# Patient Record
Sex: Female | Born: 1970
Health system: Southern US, Community
[De-identification: ages and names within clinical notes are randomized; demographics above are authoritative.]

## PROBLEM LIST (undated history)

## (undated) DIAGNOSIS — Z8619 Personal history of other infectious and parasitic diseases: Secondary | ICD-10-CM

## (undated) DIAGNOSIS — I10 Essential (primary) hypertension: Secondary | ICD-10-CM

## (undated) DIAGNOSIS — A599 Trichomoniasis, unspecified: Principal | ICD-10-CM

## (undated) HISTORY — DX: Essential (primary) hypertension: I10

## (undated) HISTORY — DX: Trichomoniasis, unspecified: A59.9

## (undated) HISTORY — DX: Personal history of other infectious and parasitic diseases: Z86.19

---

## 2008-10-06 ENCOUNTER — Other Ambulatory Visit: Admission: RE | Admit: 2008-10-06 | Discharge: 2008-10-06 | Payer: Self-pay | Admitting: Obstetrics and Gynecology

## 2009-10-16 ENCOUNTER — Other Ambulatory Visit: Admission: RE | Admit: 2009-10-16 | Discharge: 2009-10-16 | Payer: Self-pay | Admitting: Obstetrics and Gynecology

## 2010-11-26 ENCOUNTER — Other Ambulatory Visit (HOSPITAL_COMMUNITY)
Admission: RE | Admit: 2010-11-26 | Discharge: 2010-11-26 | Disposition: A | Discharge status: Home / Self Care | Payer: BC Managed Care – PPO | Source: Ambulatory Visit | Attending: Obstetrics and Gynecology | Admitting: Obstetrics and Gynecology

## 2010-11-26 ENCOUNTER — Other Ambulatory Visit: Payer: Self-pay | Admitting: Obstetrics & Gynecology

## 2010-11-26 DIAGNOSIS — Z01419 Encounter for gynecological examination (general) (routine) without abnormal findings: Secondary | ICD-10-CM | POA: Insufficient documentation

## 2010-11-26 DIAGNOSIS — Z139 Encounter for screening, unspecified: Secondary | ICD-10-CM

## 2010-12-04 ENCOUNTER — Ambulatory Visit (HOSPITAL_COMMUNITY)
Admission: RE | Admit: 2010-12-04 | Discharge: 2010-12-04 | Disposition: A | Discharge status: Home / Self Care | Payer: BC Managed Care – PPO | Source: Ambulatory Visit | Attending: Obstetrics & Gynecology | Admitting: Obstetrics & Gynecology

## 2010-12-04 ENCOUNTER — Other Ambulatory Visit: Payer: Self-pay | Admitting: Obstetrics & Gynecology

## 2010-12-04 DIAGNOSIS — Z1231 Encounter for screening mammogram for malignant neoplasm of breast: Secondary | ICD-10-CM | POA: Insufficient documentation

## 2010-12-04 DIAGNOSIS — Z139 Encounter for screening, unspecified: Secondary | ICD-10-CM

## 2011-12-31 ENCOUNTER — Other Ambulatory Visit (HOSPITAL_COMMUNITY)
Admission: RE | Admit: 2011-12-31 | Discharge: 2011-12-31 | Disposition: A | Discharge status: Home / Self Care | Payer: BC Managed Care – PPO | Source: Ambulatory Visit | Attending: Obstetrics and Gynecology | Admitting: Obstetrics and Gynecology

## 2011-12-31 DIAGNOSIS — Z1151 Encounter for screening for human papillomavirus (HPV): Secondary | ICD-10-CM | POA: Insufficient documentation

## 2011-12-31 DIAGNOSIS — Z01419 Encounter for gynecological examination (general) (routine) without abnormal findings: Secondary | ICD-10-CM | POA: Insufficient documentation

## 2012-01-21 ENCOUNTER — Other Ambulatory Visit: Payer: Self-pay | Admitting: Adult Health

## 2012-01-21 DIAGNOSIS — Z139 Encounter for screening, unspecified: Secondary | ICD-10-CM

## 2012-01-28 ENCOUNTER — Ambulatory Visit (HOSPITAL_COMMUNITY)
Admission: RE | Admit: 2012-01-28 | Discharge: 2012-01-28 | Disposition: A | Discharge status: Home / Self Care | Payer: BC Managed Care – PPO | Source: Ambulatory Visit | Attending: Adult Health | Admitting: Adult Health

## 2012-01-28 DIAGNOSIS — Z139 Encounter for screening, unspecified: Secondary | ICD-10-CM

## 2012-01-28 DIAGNOSIS — Z1231 Encounter for screening mammogram for malignant neoplasm of breast: Secondary | ICD-10-CM | POA: Insufficient documentation

## 2013-02-22 ENCOUNTER — Encounter: Payer: Self-pay | Admitting: Adult Health

## 2013-02-22 ENCOUNTER — Encounter (INDEPENDENT_AMBULATORY_CARE_PROVIDER_SITE_OTHER): Payer: Self-pay

## 2013-02-22 ENCOUNTER — Other Ambulatory Visit: Payer: Self-pay | Admitting: Adult Health

## 2013-02-22 ENCOUNTER — Ambulatory Visit (INDEPENDENT_AMBULATORY_CARE_PROVIDER_SITE_OTHER): Payer: BC Managed Care – PPO | Admitting: Adult Health

## 2013-02-22 VITALS — BP 128/90 | HR 74 | Ht 62.0 in | Wt 162.0 lb

## 2013-02-22 DIAGNOSIS — Z01419 Encounter for gynecological examination (general) (routine) without abnormal findings: Secondary | ICD-10-CM

## 2013-02-22 DIAGNOSIS — Z139 Encounter for screening, unspecified: Secondary | ICD-10-CM

## 2013-02-22 DIAGNOSIS — Z1212 Encounter for screening for malignant neoplasm of rectum: Secondary | ICD-10-CM

## 2013-02-22 DIAGNOSIS — I10 Essential (primary) hypertension: Secondary | ICD-10-CM

## 2013-02-22 LAB — HEMOCCULT GUIAC POC 1CARD (OFFICE)

## 2013-02-22 NOTE — Patient Instructions (Signed)
Physical in 1 year Mammogram now and yearly Fasting labs soon Get flu shot at work Hypertension As your heart beats, it forces blood through your arteries. This force is your blood pressure. If the pressure is too high, it is called hypertension (HTN) or high blood pressure. HTN is dangerous because you may have it and not know it. High blood pressure may mean that your heart has to work harder to pump blood. Your arteries may be narrow or stiff. The extra work puts you at risk for heart disease, stroke, and other problems.  Blood pressure consists of two numbers, a higher number over a lower, 11 0/72, for example. It is stated as "110 over 72." The ideal is below 120 for the top number (systolic) and under 80 for the bottom (diastolic). Write down your blood pressure today. You should pay close attention to your blood pressure if you have certain conditions such as:  Heart failure.  Prior heart attack.  Diabetes  Chronic kidney disease.  Prior stroke.  Multiple risk factors for heart disease. To see if you have HTN, your blood pressure should be measured while you are seated with your arm held at the level of the heart. It should be measured at least twice. A one-time elevated blood pressure reading (especially in the Emergency Department) does not mean that you need treatment. There may be conditions in which the blood pressure is different between your right and left arms. It is important to see your caregiver soon for a recheck. Most people have essential hypertension which means that there is not a specific cause. This type of high blood pressure may be lowered by changing lifestyle factors such as:  Stress.  Smoking.  Lack of exercise.  Excessive weight.  Drug/tobacco/alcohol use.  Eating less salt. Most people do not have symptoms from high blood pressure until it has caused damage to the body. Effective treatment can often prevent, delay or reduce that damage. TREATMENT    When a cause has been identified, treatment for high blood pressure is directed at the cause. There are a large number of medications to treat HTN. These fall into several categories, and your caregiver will help you select the medicines that are best for you. Medications may have side effects. You should review side effects with your caregiver. If your blood pressure stays high after you have made lifestyle changes or started on medicines,   Your medication(s) may need to be changed.  Other problems may need to be addressed.  Be certain you understand your prescriptions, and know how and when to take your medicine.  Be sure to follow up with your caregiver within the time frame advised (usually within two weeks) to have your blood pressure rechecked and to review your medications.  If you are taking more than one medicine to lower your blood pressure, make sure you know how and at what times they should be taken. Taking two medicines at the same time can result in blood pressure that is too low. SEEK IMMEDIATE MEDICAL CARE IF:  You develop a severe headache, blurred or changing vision, or confusion.  You have unusual weakness or numbness, or a faint feeling.  You have severe chest or abdominal pain, vomiting, or breathing problems. MAKE SURE YOU:   Understand these instructions.  Will watch your condition.  Will get help right away if you are not doing well or get worse. Document Released: 04/29/2005 Document Revised: 07/22/2011 Document Reviewed: 12/18/2007 ExitCare Patient Information 2014  [employer].

## 2013-02-22 NOTE — Progress Notes (Signed)
Patient ID: Braxton Weisbecker, female   DOB: Sep 14, 1970, 42 y.o.   MRN: [id number] History of Present Illness: Monika is a 42 year old black female in for physical, she had normal pap with negative HPV in 2013.  Current Medications, Allergies, Past Medical History, Past Surgical History, Family History and Social History were reviewed in [employer] [facility] record.    Review of Systems: Patient denies any headaches, blurred vision, shortness of breath, chest pain, abdominal pain, problems with bowel movements, urination, or intercourse. No joint pain or mood swings. She is using condoms.  Physical Exam:BP 128/90  Pulse 74  Ht 5\' 2"  (1.575 m)  Wt 162 lb (73.483 kg)  BMI 29.62 kg/m2  LMP 02/14/2013 General:  Well developed, well nourished, no acute distress Skin:  Warm and dry Neck:  Midline trachea, normal thyroid Lungs; Clear to auscultation bilaterally Breast:  No dominant palpable mass, retraction, or nipple discharge Cardiovascular: Regular rate and rhythm Abdomen:  Soft, non tender, no hepatosplenomegaly Pelvic:  External genitalia is normal in appearance.  The vagina is normal in appearance.   The cervix is nulliparous.  Uterus is felt to be normal size, shape, and contour.  No   adnexal masses or tenderness noted. Rectal: Good sphincter tone, no polyps, or hemorrhoids felt.  Hemoccult negative. Extremities:  No swelling or varicosities noted Psych:  No mood changes, alert and cooperative seems happy   Impression: Yearly gyn no pap Hypertension   Plan: Physical in 1 year Mammogram now and yearly CBC,CMP,TSH and lipids fasting in near future, order given Get flu shot at work Continue Microzide, review handout on hypertension

## 2013-02-25 ENCOUNTER — Ambulatory Visit (HOSPITAL_COMMUNITY)
Admission: RE | Admit: 2013-02-25 | Discharge: 2013-02-25 | Disposition: A | Discharge status: Home / Self Care | Payer: BC Managed Care – PPO | Source: Ambulatory Visit | Attending: Adult Health | Admitting: Adult Health

## 2013-02-25 ENCOUNTER — Other Ambulatory Visit: Payer: Self-pay | Admitting: Adult Health

## 2013-02-25 DIAGNOSIS — Z139 Encounter for screening, unspecified: Secondary | ICD-10-CM

## 2013-02-25 DIAGNOSIS — Z1231 Encounter for screening mammogram for malignant neoplasm of breast: Secondary | ICD-10-CM | POA: Insufficient documentation

## 2013-02-25 LAB — LIPID PANEL
Cholesterol: 208 mg/dL — ABNORMAL HIGH (ref 0–200)
HDL: 61 mg/dL (ref >39)
LDL Cholesterol: 122 mg/dL — ABNORMAL HIGH (ref 0–99)
VLDL: 25 mg/dL (ref 0–40)

## 2013-02-25 LAB — COMPREHENSIVE METABOLIC PANEL
ALT: 17 U/L (ref 0–35)
CO2: 27 mEq/L (ref 19–32)
Calcium: 9.3 mg/dL (ref 8.4–10.5)
Chloride: 103 mEq/L (ref 96–112)
Potassium: 4.5 mEq/L (ref 3.5–5.3)
Sodium: 138 mEq/L (ref 135–145)
Total Bilirubin: 0.4 mg/dL (ref 0.3–1.2)
Total Protein: 6.6 g/dL (ref 6.0–8.3)

## 2013-02-25 LAB — CBC
MCH: 27.2 pg (ref 26.0–34.0)
MCV: 78.8 fL (ref 78.0–100.0)
Platelets: 325 10*3/uL (ref 150–400)
RBC: 4.86 MIL/uL (ref 3.87–5.11)
RDW: 14.4 % (ref 11.5–15.5)
WBC: 5.4 10*3/uL (ref 4.0–10.5)

## 2013-03-01 ENCOUNTER — Telehealth: Payer: Self-pay | Admitting: Adult Health

## 2013-03-01 NOTE — Telephone Encounter (Signed)
Pt aware of labs, will mail copy 

## 2013-05-23 ENCOUNTER — Other Ambulatory Visit: Payer: Self-pay | Admitting: Adult Health

## 2013-10-01 ENCOUNTER — Other Ambulatory Visit: Payer: Self-pay | Admitting: Adult Health

## 2014-02-17 ENCOUNTER — Other Ambulatory Visit: Payer: Self-pay | Admitting: Adult Health

## 2014-02-17 DIAGNOSIS — Z1231 Encounter for screening mammogram for malignant neoplasm of breast: Secondary | ICD-10-CM

## 2014-02-23 ENCOUNTER — Other Ambulatory Visit (HOSPITAL_COMMUNITY)
Admission: RE | Admit: 2014-02-23 | Discharge: 2014-02-23 | Disposition: A | Discharge status: Home / Self Care | Payer: BC Managed Care – PPO | Source: Ambulatory Visit | Attending: Adult Health | Admitting: Adult Health

## 2014-02-23 ENCOUNTER — Ambulatory Visit (INDEPENDENT_AMBULATORY_CARE_PROVIDER_SITE_OTHER): Payer: BC Managed Care – PPO | Admitting: Adult Health

## 2014-02-23 ENCOUNTER — Encounter: Payer: Self-pay | Admitting: Adult Health

## 2014-02-23 VITALS — BP 144/88 | HR 76 | Ht 62.5 in | Wt 147.5 lb

## 2014-02-23 DIAGNOSIS — Z01419 Encounter for gynecological examination (general) (routine) without abnormal findings: Secondary | ICD-10-CM

## 2014-02-23 DIAGNOSIS — Z113 Encounter for screening for infections with a predominantly sexual mode of transmission: Secondary | ICD-10-CM | POA: Diagnosis present

## 2014-02-23 DIAGNOSIS — Z1151 Encounter for screening for human papillomavirus (HPV): Secondary | ICD-10-CM | POA: Insufficient documentation

## 2014-02-23 DIAGNOSIS — Z1212 Encounter for screening for malignant neoplasm of rectum: Secondary | ICD-10-CM

## 2014-02-23 DIAGNOSIS — I10 Essential (primary) hypertension: Secondary | ICD-10-CM

## 2014-02-23 LAB — HEMOCCULT GUIAC POC 1CARD (OFFICE): Fecal Occult Blood, POC: NEGATIVE

## 2014-02-23 MED ORDER — HYDROCHLOROTHIAZIDE 12.5 MG PO CAPS: ORAL_CAPSULE | ORAL | Status: DC

## 2014-02-23 NOTE — Progress Notes (Signed)
Patient ID: Alicia Wall, female   DOB: 10/24/1970, 43 y.o.   MRN: [id number] History of Present Illness: Alicia Wall is a 43 year old black female in for a pap and physical. No complaints, has lost about 25 lbs using smoothie cleanse.   Current Medications, Allergies, Past Medical History, Past Surgical History, Family History and Social History were reviewed in [facility] electronic medical record.     Review of Systems: Patient denies any headaches, blurred vision, shortness of breath, chest pain, abdominal pain, problems with bowel movements, urination, or intercourse. No joint pain or mood swings,says she feels great.    Physical Exam:BP 144/88  Pulse 76  Ht 5' 2.5" (1.588 m)  Wt 147 lb 8 oz (66.906 kg)  BMI 26.53 kg/m2  LMP 02/12/2014 General:  Well developed, well nourished, no acute distress Skin:  Warm and dry Neck:  Midline trachea, normal thyroid Lungs; Clear to auscultation bilaterally Breast:  No dominant palpable mass, retraction, or nipple discharge Cardiovascular: Regular rate and rhythm Abdomen:  Soft, non tender, no hepatosplenomegaly Pelvic:  External genitalia is normal in appearance.  The vagina is normal in appearance.   The cervix is smooth,pap with HPV and GC/CHL performed.Marland Kitchen.  Uterus is felt to be normal size, shape, and contour.  No   adnexal masses or tenderness noted. Rectal: Good sphincter tone, no polyps, or hemorrhoids felt.  Hemoccult negative. Extremities:  No swelling or varicosities noted Psych:  No mood changes, alert and cooperative,seems happy   Impression:  Well woman gyn exam Hypertension    Plan: Refilled microzide 12.5 mg #30 1 daily with 11 refills Physical in 1 year Mammogram yearly

## 2014-02-23 NOTE — Patient Instructions (Signed)
Physical in 1 year mammogram yearly Continue BP meds

## 2014-02-25 LAB — CYTOLOGY - PAP

## 2014-02-28 ENCOUNTER — Ambulatory Visit (HOSPITAL_COMMUNITY)
Admission: RE | Admit: 2014-02-28 | Discharge: 2014-02-28 | Disposition: A | Discharge status: Home / Self Care | Payer: BC Managed Care – PPO | Source: Ambulatory Visit | Attending: Adult Health | Admitting: Adult Health

## 2014-02-28 ENCOUNTER — Telehealth: Payer: Self-pay | Admitting: Adult Health

## 2014-02-28 DIAGNOSIS — Z1231 Encounter for screening mammogram for malignant neoplasm of breast: Secondary | ICD-10-CM | POA: Diagnosis present

## 2014-02-28 NOTE — Telephone Encounter (Signed)
No answer

## 2014-03-01 ENCOUNTER — Telehealth: Payer: Self-pay | Admitting: Adult Health

## 2014-03-01 NOTE — Telephone Encounter (Signed)
No answer

## 2014-03-02 ENCOUNTER — Telehealth: Payer: Self-pay | Admitting: Adult Health

## 2014-03-02 MED ORDER — METRONIDAZOLE 500 MG PO TABS: ORAL_TABLET | ORAL | Status: DC

## 2014-03-02 NOTE — Telephone Encounter (Signed)
Pt aware pap negative with negative HPV and GC/CHL but + trich, will rx flagyl 500 mg #4 4 po now and do POT 11/2 at 3 pm ,will talk with partner, I told her I could treat him too, will call me back, no sex no alcohol.

## 2014-03-14 ENCOUNTER — Encounter: Payer: Self-pay | Admitting: Adult Health

## 2014-03-14 ENCOUNTER — Ambulatory Visit (INDEPENDENT_AMBULATORY_CARE_PROVIDER_SITE_OTHER): Payer: BC Managed Care – PPO | Admitting: Adult Health

## 2014-03-14 VITALS — BP 140/90 | Ht 62.0 in | Wt 150.0 lb

## 2014-03-14 DIAGNOSIS — A599 Trichomoniasis, unspecified: Secondary | ICD-10-CM

## 2014-03-14 HISTORY — DX: Trichomoniasis, unspecified: A59.9

## 2014-03-14 LAB — POCT WET PREP (WET MOUNT)

## 2014-03-14 NOTE — Progress Notes (Signed)
Subjective:     Patient ID: Alicia Wall, female   DOB: 30-Aug-1970, 43 y.o.   MRN: [id number]  HPI Alicia Wall is a 43 year old black female in for proof of treatment for recent trich on pap.Took meds. With no problems.  Review of Systems See HPI Reviewed past medical,surgical, social and family history. Reviewed medications and allergies.     Objective:   Physical Exam BP 140/90 mmHg  Ht 5\' 2"  (1.575 m)  Wt 150 lb (68.04 kg)  BMI 27.43 kg/m2  LMP 03/08/2014   Skin warm and dry.Pelvic: external genitalia is normal in appearance, vagina: scant brown discharge without odor, cervix:smooth, uterus: normal size, shape and contour, non tender, no masses felt, adnexa: no masses or tenderness noted. Wet prep: few WBC no trich seen  Assessment:     Trichomonas on pap, in for proof of treatment    Plan:     Follow up prn   Use condoms

## 2014-03-14 NOTE — Patient Instructions (Signed)
Trichomoniasis Trichomoniasis is an infection caused by an organism called Trichomonas. The infection can affect both women and men. In women, the outer female genitalia and the vagina are affected. In men, the penis is mainly affected, but the prostate and other reproductive organs can also be involved. Trichomoniasis is a sexually transmitted infection (STI) and is most often passed to another person through sexual contact.  RISK FACTORS  Having unprotected sexual intercourse.  Having sexual intercourse with an infected partner. SIGNS AND SYMPTOMS  Symptoms of trichomoniasis in women include:  Abnormal gray-green frothy vaginal discharge.  Itching and irritation of the vagina.  Itching and irritation of the area outside the vagina. Symptoms of trichomoniasis in men include:   Penile discharge with or without pain.  Pain during urination. This results from inflammation of the urethra. DIAGNOSIS  Trichomoniasis may be found during a Pap test or physical exam. Your health care provider may use one of the following methods to help diagnose this infection:  Examining vaginal discharge under a microscope. For men, urethral discharge would be examined.  Testing the pH of the vagina with a test tape.  Using a vaginal swab test that checks for the Trichomonas organism. A test is available that provides results within a few minutes.  Doing a culture test for the organism. This is not usually needed. TREATMENT   You may be given medicine to fight the infection. Women should inform their health care provider if they could be or are pregnant. Some medicines used to treat the infection should not be taken during pregnancy.  Your health care provider may recommend over-the-counter medicines or creams to decrease itching or irritation.  Your sexual partner will need to be treated if infected. HOME CARE INSTRUCTIONS   Take medicines only as directed by your health care provider.  Take  over-the-counter medicine for itching or irritation as directed by your health care provider.  Do not have sexual intercourse while you have the infection.  Women should not douche or wear tampons while they have the infection.  Discuss your infection with your partner. Your partner may have gotten the infection from you, or you may have gotten it from your partner.  Have your sex partner get examined and treated if necessary.  Practice safe, informed, and protected sex.  See your health care provider for other STI testing. SEEK MEDICAL CARE IF:   You still have symptoms after you finish your medicine.  You develop abdominal pain.  You have pain when you urinate.  You have bleeding after sexual intercourse.  You develop a rash.  Your medicine makes you sick or makes you throw up (vomit). MAKE SURE YOU:  Understand these instructions.  Will watch your condition.  Will get help right away if you are not doing well or get worse. Document Released: 10/23/2000 Document Revised: 09/13/2013 Document Reviewed: 02/08/2013 [facility] Patient Information 2015 [place], [place]. This information is not intended to replace advice given to you by your health care provider. Make sure you discuss any questions you have with your health care provider. NONE on wet prep seen to day

## 2014-03-17 ENCOUNTER — Other Ambulatory Visit: Payer: BC Managed Care – PPO

## 2014-03-17 DIAGNOSIS — Z13220 Encounter for screening for lipoid disorders: Secondary | ICD-10-CM

## 2014-03-17 DIAGNOSIS — Z0189 Encounter for other specified special examinations: Secondary | ICD-10-CM

## 2014-03-17 DIAGNOSIS — I10 Essential (primary) hypertension: Secondary | ICD-10-CM

## 2014-03-17 DIAGNOSIS — Z1329 Encounter for screening for other suspected endocrine disorder: Secondary | ICD-10-CM

## 2014-03-18 ENCOUNTER — Telehealth: Payer: Self-pay | Admitting: Adult Health

## 2014-03-18 LAB — LIPID PANEL
CHOL/HDL RATIO: 3.1 ratio
Cholesterol: 189 mg/dL (ref 0–200)
HDL: 61 mg/dL (ref >39)
LDL Cholesterol: 116 mg/dL — ABNORMAL HIGH (ref 0–99)
Triglycerides: 58 mg/dL (ref <150)
VLDL: 12 mg/dL (ref 0–40)

## 2014-03-18 LAB — CBC
HCT: 39.8 % (ref 36.0–46.0)
Hemoglobin: 13.2 g/dL (ref 12.0–15.0)
MCH: 27 pg (ref 26.0–34.0)
MCHC: 33.2 g/dL (ref 30.0–36.0)
MCV: 81.4 fL (ref 78.0–100.0)
PLATELETS: 322 10*3/uL (ref 150–400)
RBC: 4.89 MIL/uL (ref 3.87–5.11)
RDW: 14.7 % (ref 11.5–15.5)
WBC: 5.4 10*3/uL (ref 4.0–10.5)

## 2014-03-18 LAB — COMPREHENSIVE METABOLIC PANEL
ALT: 14 U/L (ref 0–35)
AST: 19 U/L (ref 0–37)
Albumin: 4.1 g/dL (ref 3.5–5.2)
Alkaline Phosphatase: 52 U/L (ref 39–117)
BILIRUBIN TOTAL: 0.4 mg/dL (ref 0.2–1.2)
BUN: 12 mg/dL (ref 6–23)
CO2: 26 meq/L (ref 19–32)
Calcium: 9 mg/dL (ref 8.4–10.5)
Chloride: 102 mEq/L (ref 96–112)
Creat: 1.04 mg/dL (ref 0.50–1.10)
GLUCOSE: 73 mg/dL (ref 70–99)
Potassium: 4.7 mEq/L (ref 3.5–5.3)
SODIUM: 138 meq/L (ref 135–145)
TOTAL PROTEIN: 6.5 g/dL (ref 6.0–8.3)

## 2014-03-18 LAB — TSH: TSH: 0.233 u[IU]/mL — ABNORMAL LOW (ref 0.350–4.500)

## 2014-03-18 NOTE — Telephone Encounter (Signed)
Left message labs good. 

## 2015-02-06 ENCOUNTER — Other Ambulatory Visit: Payer: Self-pay | Admitting: Adult Health

## 2015-02-06 DIAGNOSIS — Z1231 Encounter for screening mammogram for malignant neoplasm of breast: Secondary | ICD-10-CM

## 2015-02-07 ENCOUNTER — Other Ambulatory Visit: Payer: Self-pay | Admitting: Adult Health

## 2015-02-07 DIAGNOSIS — Z1231 Encounter for screening mammogram for malignant neoplasm of breast: Secondary | ICD-10-CM

## 2015-02-28 ENCOUNTER — Other Ambulatory Visit: Payer: Self-pay | Admitting: Adult Health

## 2015-03-01 ENCOUNTER — Encounter: Payer: Self-pay | Admitting: Adult Health

## 2015-03-01 ENCOUNTER — Ambulatory Visit (INDEPENDENT_AMBULATORY_CARE_PROVIDER_SITE_OTHER): Payer: BLUE CROSS/BLUE SHIELD | Admitting: Adult Health

## 2015-03-01 VITALS — BP 138/78 | HR 88 | Ht 62.5 in | Wt 141.5 lb

## 2015-03-01 DIAGNOSIS — Z01419 Encounter for gynecological examination (general) (routine) without abnormal findings: Secondary | ICD-10-CM | POA: Diagnosis not present

## 2015-03-01 DIAGNOSIS — Z1212 Encounter for screening for malignant neoplasm of rectum: Secondary | ICD-10-CM | POA: Diagnosis not present

## 2015-03-01 DIAGNOSIS — I10 Essential (primary) hypertension: Secondary | ICD-10-CM

## 2015-03-01 LAB — HEMOCCULT GUIAC POC 1CARD (OFFICE): Fecal Occult Blood, POC: NEGATIVE

## 2015-03-01 MED ORDER — HYDROCHLOROTHIAZIDE 12.5 MG PO CAPS: 12.5000 mg | ORAL_CAPSULE | Freq: Every day | ORAL | Status: DC

## 2015-03-01 NOTE — Patient Instructions (Signed)
physical in 1 year Mammogram yearly

## 2015-03-01 NOTE — Progress Notes (Signed)
Patient ID: Alicia Wall, female   DOB: 26-Oct-1970, 44 y.o.   MRN: [id number] History of Present Illness: Alicia Wall is a 44 year old black female,single,G0P0,in for a well woman gyn exam,she had a normal pap with negative HPV 02/23/14.   Current Medications, Allergies, Past Medical History, Past Surgical History, Family History and Social History were reviewed in [facility] electronic medical record.     Review of Systems: Patient denies any headaches, hearing loss, fatigue, blurred vision, shortness of breath, chest pain, abdominal pain, problems with bowel movements, urination, or intercourse. No joint pain or mood swings.    Physical Exam:BP 138/78 mmHg  Pulse 88  Ht 5' 2.5" (1.588 m)  Wt 141 lb 8 oz (64.184 kg)  BMI 25.45 kg/m2  LMP 02/06/2015 General:  Well developed, well nourished, no acute distress Skin:  Warm and dry Neck:  Midline trachea, normal thyroid, good ROM, no lymphadenopathy Lungs; Clear to auscultation bilaterally Breast:  No dominant palpable mass, retraction, or nipple discharge Cardiovascular: Regular rate and rhythm Abdomen:  Soft, non tender, no hepatosplenomegaly Pelvic:  External genitalia is normal in appearance, no lesions.  The vagina is normal in appearance,with period like blood. Urethra has no lesions or masses. The cervix is smooth.  Uterus is felt to be normal size, shape, and contour.  No adnexal masses or tenderness noted.Bladder is non tender, no masses felt. Rectal: Good sphincter tone, no polyps, or hemorrhoids felt.  Hemoccult negative. Extremities/musculoskeletal:  No swelling or varicosities noted, no clubbing or cyanosis Psych:  No mood changes, alert and cooperative,seems happy Discussed peri menopause symptoms.Had normal labs last year.  Impression: Well woman gyn exam no pap Hypertension     Plan: Refilled Microzide 12.5 mg #30 take 1 daily with 12 refills Physical in  1 year Mammogram scheduled for Tuesday then  yearly Will get fasting labs next year

## 2015-03-06 ENCOUNTER — Other Ambulatory Visit: Payer: BLUE CROSS/BLUE SHIELD

## 2015-03-06 ENCOUNTER — Ambulatory Visit (HOSPITAL_COMMUNITY)
Admission: RE | Admit: 2015-03-06 | Discharge: 2015-03-06 | Disposition: A | Discharge status: Home / Self Care | Payer: BLUE CROSS/BLUE SHIELD | Source: Ambulatory Visit | Attending: Adult Health | Admitting: Adult Health

## 2015-03-06 ENCOUNTER — Other Ambulatory Visit: Payer: Self-pay | Admitting: Adult Health

## 2015-03-06 DIAGNOSIS — Z1231 Encounter for screening mammogram for malignant neoplasm of breast: Secondary | ICD-10-CM

## 2015-03-06 DIAGNOSIS — Z01419 Encounter for gynecological examination (general) (routine) without abnormal findings: Secondary | ICD-10-CM

## 2015-03-06 DIAGNOSIS — Z1329 Encounter for screening for other suspected endocrine disorder: Secondary | ICD-10-CM

## 2015-03-07 LAB — CBC
HEMATOCRIT: 42.6 % (ref 34.0–46.6)
HEMOGLOBIN: 14.2 g/dL (ref 11.1–15.9)
MCH: 27.5 pg (ref 26.6–33.0)
MCHC: 33.3 g/dL (ref 31.5–35.7)
MCV: 82 fL (ref 79–97)
Platelets: 321 10*3/uL (ref 150–379)
RBC: 5.17 x10E6/uL (ref 3.77–5.28)
RDW: 14.5 % (ref 12.3–15.4)
WBC: 5.8 10*3/uL (ref 3.4–10.8)

## 2015-03-07 LAB — LIPID PANEL
CHOL/HDL RATIO: 2.9 ratio (ref 0.0–4.4)
Cholesterol, Total: 224 mg/dL — ABNORMAL HIGH (ref 100–199)
HDL: 76 mg/dL (ref >39)
LDL Calculated: 135 mg/dL — ABNORMAL HIGH (ref 0–99)
TRIGLYCERIDES: 63 mg/dL (ref 0–149)
VLDL CHOLESTEROL CAL: 13 mg/dL (ref 5–40)

## 2015-03-07 LAB — TSH: TSH: 0.436 u[IU]/mL — AB (ref 0.450–4.500)

## 2015-03-09 ENCOUNTER — Telehealth: Payer: Self-pay | Admitting: Adult Health

## 2015-03-09 NOTE — Telephone Encounter (Signed)
Left message about labs

## 2015-03-13 ENCOUNTER — Encounter: Payer: BLUE CROSS/BLUE SHIELD | Admitting: Adult Health

## 2015-03-22 LAB — SPECIMEN STATUS REPORT

## 2015-03-30 LAB — COMPREHENSIVE METABOLIC PANEL
A/G RATIO: 1.5 (ref 1.1–2.5)
ALT: 25 IU/L (ref 0–32)
AST: 26 IU/L (ref 0–40)
Albumin: 4.3 g/dL (ref 3.5–5.5)
Alkaline Phosphatase: 59 IU/L (ref 39–117)
BUN/Creatinine Ratio: 18 (ref 9–23)
BUN: 20 mg/dL (ref 6–24)
Bilirubin Total: <0.2 mg/dL (ref 0.0–1.2)
CALCIUM: 9.8 mg/dL (ref 8.7–10.2)
CO2: 21 mmol/L (ref 18–29)
CREATININE: 1.13 mg/dL — AB (ref 0.57–1.00)
Chloride: 101 mmol/L (ref 97–106)
GFR calc non Af Amer: 59 mL/min/{1.73_m2} — ABNORMAL LOW (ref >59)
GFR, EST AFRICAN AMERICAN: 68 mL/min/{1.73_m2} (ref >59)
Globulin, Total: 2.8 g/dL (ref 1.5–4.5)
Glucose: 86 mg/dL (ref 65–99)
Potassium: 4.9 mmol/L (ref 3.5–5.2)
Sodium: 142 mmol/L (ref 136–144)
Total Protein: 7.1 g/dL (ref 6.0–8.5)

## 2015-03-30 LAB — SPECIMEN STATUS REPORT

## 2015-12-19 ENCOUNTER — Other Ambulatory Visit (HOSPITAL_COMMUNITY): Payer: Self-pay | Admitting: Preventative Medicine

## 2015-12-19 DIAGNOSIS — R9389 Abnormal findings on diagnostic imaging of other specified body structures: Secondary | ICD-10-CM

## 2015-12-22 ENCOUNTER — Ambulatory Visit (HOSPITAL_COMMUNITY): Payer: BLUE CROSS/BLUE SHIELD

## 2015-12-25 ENCOUNTER — Ambulatory Visit (HOSPITAL_COMMUNITY)
Admission: RE | Admit: 2015-12-25 | Discharge: 2015-12-25 | Disposition: A | Discharge status: Home / Self Care | Payer: BLUE CROSS/BLUE SHIELD | Source: Ambulatory Visit | Attending: Preventative Medicine | Admitting: Preventative Medicine

## 2015-12-25 DIAGNOSIS — R0602 Shortness of breath: Secondary | ICD-10-CM | POA: Diagnosis not present

## 2015-12-25 DIAGNOSIS — R9389 Abnormal findings on diagnostic imaging of other specified body structures: Secondary | ICD-10-CM

## 2015-12-25 DIAGNOSIS — J189 Pneumonia, unspecified organism: Secondary | ICD-10-CM | POA: Insufficient documentation

## 2015-12-25 MED ORDER — IOPAMIDOL (ISOVUE-300) INJECTION 61%
75.0000 mL | Freq: Once | INTRAVENOUS | Status: AC | PRN
  Administered 2015-12-25: 75 mL via INTRAVENOUS

## 2015-12-25 MED ORDER — IOPAMIDOL (ISOVUE-300) INJECTION 61%: 100.0000 mL | Freq: Once | INTRAVENOUS | Status: DC | PRN

## 2015-12-27 ENCOUNTER — Other Ambulatory Visit (HOSPITAL_COMMUNITY): Payer: Self-pay | Admitting: Preventative Medicine

## 2015-12-27 DIAGNOSIS — R9389 Abnormal findings on diagnostic imaging of other specified body structures: Secondary | ICD-10-CM

## 2015-12-27 DIAGNOSIS — J690 Pneumonitis due to inhalation of food and vomit: Secondary | ICD-10-CM

## 2016-01-03 ENCOUNTER — Ambulatory Visit (HOSPITAL_COMMUNITY): Admission: RE | Admit: 2016-01-03 | Payer: BLUE CROSS/BLUE SHIELD | Source: Ambulatory Visit

## 2016-01-10 ENCOUNTER — Ambulatory Visit (HOSPITAL_COMMUNITY)
Admission: RE | Admit: 2016-01-10 | Discharge: 2016-01-10 | Disposition: A | Discharge status: Home / Self Care | Payer: BLUE CROSS/BLUE SHIELD | Source: Ambulatory Visit | Attending: Preventative Medicine | Admitting: Preventative Medicine

## 2016-01-10 DIAGNOSIS — R9389 Abnormal findings on diagnostic imaging of other specified body structures: Secondary | ICD-10-CM

## 2016-01-10 DIAGNOSIS — J189 Pneumonia, unspecified organism: Secondary | ICD-10-CM | POA: Diagnosis not present

## 2016-01-10 DIAGNOSIS — R918 Other nonspecific abnormal finding of lung field: Secondary | ICD-10-CM | POA: Diagnosis not present

## 2016-01-10 DIAGNOSIS — J690 Pneumonitis due to inhalation of food and vomit: Secondary | ICD-10-CM

## 2016-01-10 MED ORDER — IOPAMIDOL (ISOVUE-300) INJECTION 61%
75.0000 mL | Freq: Once | INTRAVENOUS | Status: AC | PRN
  Administered 2016-01-10: 75 mL via INTRAVENOUS

## 2016-01-16 DIAGNOSIS — R938 Abnormal findings on diagnostic imaging of other specified body structures: Secondary | ICD-10-CM | POA: Diagnosis not present

## 2016-02-20 ENCOUNTER — Other Ambulatory Visit: Payer: Self-pay | Admitting: Adult Health

## 2016-02-20 DIAGNOSIS — Z1231 Encounter for screening mammogram for malignant neoplasm of breast: Secondary | ICD-10-CM

## 2016-03-04 ENCOUNTER — Other Ambulatory Visit: Payer: BLUE CROSS/BLUE SHIELD | Admitting: Adult Health

## 2016-03-07 ENCOUNTER — Ambulatory Visit (INDEPENDENT_AMBULATORY_CARE_PROVIDER_SITE_OTHER): Payer: BLUE CROSS/BLUE SHIELD | Admitting: Adult Health

## 2016-03-07 ENCOUNTER — Other Ambulatory Visit (HOSPITAL_COMMUNITY)
Admission: RE | Admit: 2016-03-07 | Discharge: 2016-03-07 | Disposition: A | Discharge status: Home / Self Care | Payer: BLUE CROSS/BLUE SHIELD | Source: Ambulatory Visit | Attending: Adult Health | Admitting: Adult Health

## 2016-03-07 ENCOUNTER — Encounter: Payer: Self-pay | Admitting: Adult Health

## 2016-03-07 VITALS — BP 140/90 | HR 78 | Ht 62.75 in | Wt 152.5 lb

## 2016-03-07 DIAGNOSIS — Z113 Encounter for screening for infections with a predominantly sexual mode of transmission: Secondary | ICD-10-CM | POA: Insufficient documentation

## 2016-03-07 DIAGNOSIS — Z1151 Encounter for screening for human papillomavirus (HPV): Secondary | ICD-10-CM | POA: Diagnosis not present

## 2016-03-07 DIAGNOSIS — Z1212 Encounter for screening for malignant neoplasm of rectum: Secondary | ICD-10-CM | POA: Diagnosis not present

## 2016-03-07 DIAGNOSIS — Z01419 Encounter for gynecological examination (general) (routine) without abnormal findings: Secondary | ICD-10-CM

## 2016-03-07 DIAGNOSIS — I10 Essential (primary) hypertension: Secondary | ICD-10-CM

## 2016-03-07 LAB — HEMOCCULT GUIAC POC 1CARD (OFFICE): Fecal Occult Blood, POC: NEGATIVE

## 2016-03-07 MED ORDER — HYDROCHLOROTHIAZIDE 12.5 MG PO CAPS: 12.5000 mg | ORAL_CAPSULE | Freq: Every day | ORAL | 12 refills | Status: DC

## 2016-03-07 NOTE — Patient Instructions (Signed)
Physical in 1 year Pap in 3 if normal Mammogram yearly  

## 2016-03-07 NOTE — Progress Notes (Signed)
Patient ID: Alicia Wall, female   DOB: 05/31/1970, 45 y.o.   MRN: [id number] History of Present Illness: Alicia Wall is a 45 year old black female in for well woman gyn exam and pap.She has had pneumonia this year and was seen by Dr Mosetta PigeonHawkins,and she had flu shot in September. [place] which is [employer].    Current Medications, Allergies, Past Medical History, Past Surgical History, Family History and Social History were reviewed in [facility] electronic medical record.     Review of Systems: Patient denies any headaches, hearing loss, fatigue, blurred vision, shortness of breath, chest pain, abdominal pain, problems with bowel movements, urination, or intercourse. No joint pain or mood swings.    Physical Exam:BP 140/90 (BP Location: Right Arm, Patient Position: Sitting, Cuff Size: Normal)   Pulse 78   Ht 5' 2.75" (1.594 m)   Wt 152 lb 8 oz (69.2 kg)   LMP 02/29/2016 (Exact Date)   BMI 27.23 kg/m  General:  Well developed, well nourished, no acute distress Skin:  Warm and dry Neck:  Midline trachea, normal thyroid, good ROM, no lymphadenopathy Lungs; Clear to auscultation bilaterally Breast:  No dominant palpable mass, retraction, or nipple discharge Cardiovascular: Regular rate and rhythm Abdomen:  Soft, non tender, no hepatosplenomegaly Pelvic:  External genitalia is normal in appearance, no lesions.  The vagina is normal in appearance. Urethra has no lesions or masses. The cervix is smooth, pap with HPV and GC/CHL performed.  Uterus is felt to be normal size, shape, and contour.  No adnexal masses or tenderness noted.Bladder is non tender, no masses felt. Rectal: Good sphincter tone, no polyps, or hemorrhoids felt.  Hemoccult negative. Extremities/musculoskeletal:  No swelling or varicosities noted, no clubbing or cyanosis Psych:  No mood changes, alert and cooperative,seems happy PHQ 2 score 0.  Impression:  1. Encounter for  gynecological examination with Papanicolaou smear of cervix   2. Essential hypertension      Plan: Check CBC,CMP,TSH and lipids,A1c and vitamin D Physical in 1 year Pap in 3 if normal Mammogram yearly

## 2016-03-08 LAB — HEMOGLOBIN A1C
Est. average glucose Bld gHb Est-mCnc: 114 mg/dL
HEMOGLOBIN A1C: 5.6 % (ref 4.8–5.6)

## 2016-03-08 LAB — CYTOLOGY - PAP
Chlamydia: NEGATIVE
DIAGNOSIS: NEGATIVE
HPV: NOT DETECTED
NEISSERIA GONORRHEA: NEGATIVE

## 2016-03-08 LAB — CBC
Hematocrit: 39.9 % (ref 34.0–46.6)
Hemoglobin: 13.1 g/dL (ref 11.1–15.9)
MCH: 26.7 pg (ref 26.6–33.0)
MCHC: 32.8 g/dL (ref 31.5–35.7)
MCV: 81 fL (ref 79–97)
Platelets: 318 10*3/uL (ref 150–379)
RBC: 4.9 x10E6/uL (ref 3.77–5.28)
RDW: 15.3 % (ref 12.3–15.4)
WBC: 6.3 10*3/uL (ref 3.4–10.8)

## 2016-03-08 LAB — COMPREHENSIVE METABOLIC PANEL
ALBUMIN: 4.3 g/dL (ref 3.5–5.5)
ALK PHOS: 64 IU/L (ref 39–117)
ALT: 21 IU/L (ref 0–32)
AST: 26 IU/L (ref 0–40)
Albumin/Globulin Ratio: 1.5 (ref 1.2–2.2)
BILIRUBIN TOTAL: 0.3 mg/dL (ref 0.0–1.2)
BUN / CREAT RATIO: 20 (ref 9–23)
BUN: 17 mg/dL (ref 6–24)
CHLORIDE: 100 mmol/L (ref 96–106)
CO2: 26 mmol/L (ref 18–29)
CREATININE: 0.87 mg/dL (ref 0.57–1.00)
Calcium: 9.4 mg/dL (ref 8.7–10.2)
GFR calc Af Amer: 92 mL/min/{1.73_m2} (ref >59)
GFR calc non Af Amer: 80 mL/min/{1.73_m2} (ref >59)
Globulin, Total: 2.9 g/dL (ref 1.5–4.5)
Glucose: 79 mg/dL (ref 65–99)
Potassium: 4.4 mmol/L (ref 3.5–5.2)
SODIUM: 140 mmol/L (ref 134–144)
Total Protein: 7.2 g/dL (ref 6.0–8.5)

## 2016-03-08 LAB — VITAMIN D 25 HYDROXY (VIT D DEFICIENCY, FRACTURES): Vit D, 25-Hydroxy: 38.4 ng/mL (ref 30.0–100.0)

## 2016-03-08 LAB — LIPID PANEL
CHOLESTEROL TOTAL: 262 mg/dL — AB (ref 100–199)
Chol/HDL Ratio: 2.8 ratio units (ref 0.0–4.4)
HDL: 94 mg/dL (ref >39)
LDL Calculated: 152 mg/dL — ABNORMAL HIGH (ref 0–99)
Triglycerides: 80 mg/dL (ref 0–149)
VLDL CHOLESTEROL CAL: 16 mg/dL (ref 5–40)

## 2016-03-08 LAB — TSH: TSH: 0.805 u[IU]/mL (ref 0.450–4.500)

## 2016-03-11 ENCOUNTER — Telehealth: Payer: Self-pay | Admitting: Adult Health

## 2016-03-11 ENCOUNTER — Ambulatory Visit (HOSPITAL_COMMUNITY): Payer: BLUE CROSS/BLUE SHIELD

## 2016-03-11 NOTE — Telephone Encounter (Signed)
Left message that labs looked good and pap was normal with negative GC/CHL and HPV

## 2016-04-01 ENCOUNTER — Ambulatory Visit (HOSPITAL_COMMUNITY)
Admission: RE | Admit: 2016-04-01 | Discharge: 2016-04-01 | Disposition: A | Discharge status: Home / Self Care | Payer: BLUE CROSS/BLUE SHIELD | Source: Ambulatory Visit | Attending: Adult Health | Admitting: Adult Health

## 2016-04-01 DIAGNOSIS — Z1231 Encounter for screening mammogram for malignant neoplasm of breast: Secondary | ICD-10-CM | POA: Diagnosis not present

## 2016-04-25 ENCOUNTER — Other Ambulatory Visit (HOSPITAL_COMMUNITY): Payer: Self-pay | Admitting: Pulmonary Disease

## 2016-04-25 ENCOUNTER — Ambulatory Visit (HOSPITAL_COMMUNITY)
Admission: RE | Admit: 2016-04-25 | Discharge: 2016-04-25 | Disposition: A | Discharge status: Home / Self Care | Payer: BLUE CROSS/BLUE SHIELD | Source: Ambulatory Visit | Attending: Pulmonary Disease | Admitting: Pulmonary Disease

## 2016-04-25 DIAGNOSIS — J189 Pneumonia, unspecified organism: Secondary | ICD-10-CM | POA: Insufficient documentation

## 2016-04-25 DIAGNOSIS — R9389 Abnormal findings on diagnostic imaging of other specified body structures: Secondary | ICD-10-CM

## 2016-04-30 ENCOUNTER — Other Ambulatory Visit (HOSPITAL_COMMUNITY): Payer: Self-pay | Admitting: Pulmonary Disease

## 2016-04-30 ENCOUNTER — Ambulatory Visit (HOSPITAL_COMMUNITY)
Admission: RE | Admit: 2016-04-30 | Discharge: 2016-04-30 | Disposition: A | Discharge status: Home / Self Care | Payer: BLUE CROSS/BLUE SHIELD | Source: Ambulatory Visit | Attending: Pulmonary Disease | Admitting: Pulmonary Disease

## 2016-04-30 DIAGNOSIS — R918 Other nonspecific abnormal finding of lung field: Secondary | ICD-10-CM | POA: Diagnosis not present

## 2016-04-30 DIAGNOSIS — J690 Pneumonitis due to inhalation of food and vomit: Secondary | ICD-10-CM | POA: Diagnosis not present

## 2016-05-01 DIAGNOSIS — J189 Pneumonia, unspecified organism: Secondary | ICD-10-CM | POA: Diagnosis not present

## 2016-05-01 DIAGNOSIS — R938 Abnormal findings on diagnostic imaging of other specified body structures: Secondary | ICD-10-CM | POA: Diagnosis not present

## 2016-06-26 DIAGNOSIS — J209 Acute bronchitis, unspecified: Secondary | ICD-10-CM | POA: Diagnosis not present

## 2016-09-03 ENCOUNTER — Other Ambulatory Visit (HOSPITAL_COMMUNITY): Payer: Self-pay | Admitting: Internal Medicine

## 2016-09-03 ENCOUNTER — Ambulatory Visit (HOSPITAL_COMMUNITY)
Admission: RE | Admit: 2016-09-03 | Discharge: 2016-09-03 | Disposition: A | Discharge status: Home / Self Care | Payer: BLUE CROSS/BLUE SHIELD | Source: Ambulatory Visit | Attending: Internal Medicine | Admitting: Internal Medicine

## 2016-09-03 DIAGNOSIS — S4992XA Unspecified injury of left shoulder and upper arm, initial encounter: Secondary | ICD-10-CM | POA: Diagnosis not present

## 2016-09-03 DIAGNOSIS — M25712 Osteophyte, left shoulder: Secondary | ICD-10-CM | POA: Diagnosis not present

## 2016-09-03 DIAGNOSIS — M25512 Pain in left shoulder: Secondary | ICD-10-CM | POA: Diagnosis not present

## 2016-09-03 DIAGNOSIS — W19XXXA Unspecified fall, initial encounter: Secondary | ICD-10-CM

## 2017-01-21 DIAGNOSIS — H40003 Preglaucoma, unspecified, bilateral: Secondary | ICD-10-CM | POA: Diagnosis not present

## 2017-03-11 ENCOUNTER — Other Ambulatory Visit: Payer: BLUE CROSS/BLUE SHIELD | Admitting: Adult Health

## 2017-03-11 ENCOUNTER — Other Ambulatory Visit: Payer: Self-pay | Admitting: Adult Health

## 2017-03-11 DIAGNOSIS — Z1231 Encounter for screening mammogram for malignant neoplasm of breast: Secondary | ICD-10-CM

## 2017-03-17 ENCOUNTER — Other Ambulatory Visit: Payer: Self-pay | Admitting: Adult Health

## 2017-03-18 ENCOUNTER — Encounter: Payer: Self-pay | Admitting: Adult Health

## 2017-03-18 ENCOUNTER — Ambulatory Visit (INDEPENDENT_AMBULATORY_CARE_PROVIDER_SITE_OTHER): Payer: BLUE CROSS/BLUE SHIELD | Admitting: Adult Health

## 2017-03-18 VITALS — BP 136/78 | HR 84 | Resp 16 | Ht 62.75 in | Wt 149.0 lb

## 2017-03-18 DIAGNOSIS — Z1212 Encounter for screening for malignant neoplasm of rectum: Secondary | ICD-10-CM | POA: Diagnosis not present

## 2017-03-18 DIAGNOSIS — Z01419 Encounter for gynecological examination (general) (routine) without abnormal findings: Secondary | ICD-10-CM | POA: Diagnosis not present

## 2017-03-18 DIAGNOSIS — Z1321 Encounter for screening for nutritional disorder: Secondary | ICD-10-CM

## 2017-03-18 DIAGNOSIS — Z1211 Encounter for screening for malignant neoplasm of colon: Secondary | ICD-10-CM | POA: Diagnosis not present

## 2017-03-18 DIAGNOSIS — I10 Essential (primary) hypertension: Secondary | ICD-10-CM

## 2017-03-18 DIAGNOSIS — Z131 Encounter for screening for diabetes mellitus: Secondary | ICD-10-CM

## 2017-03-18 LAB — HEMOCCULT GUIAC POC 1CARD (OFFICE): FECAL OCCULT BLD: NEGATIVE

## 2017-03-18 MED ORDER — HYDROCHLOROTHIAZIDE 12.5 MG PO CAPS: 12.5000 mg | ORAL_CAPSULE | Freq: Every day | ORAL | 4 refills | Status: DC

## 2017-03-18 NOTE — Progress Notes (Signed)
Patient ID: Alicia Wall, female   DOB: [birth year], 46 y.o.   MRN: [id number] History of Present Illness:  Alicia Wall is a 46 year old black female in for well woman gyn exam, she had a normal pap with negative HPV 03/07/16.   Current Medications, Allergies, Past Medical History, Past Surgical History, Family History and Social History were reviewed in [facility] electronic medical record.     Review of Systems: Patient denies any headaches, hearing loss, fatigue, blurred vision, shortness of breath, chest pain, abdominal pain, problems with bowel movements, urination, or intercourse. No joint pain or mood swings.    Physical Exam:BP 136/78 (BP Location: Left Arm, Patient Position: Sitting, Cuff Size: Normal)   Pulse 84   Resp 16   Ht 5' 2.75" (1.594 m)   Wt 149 lb (67.6 kg)   LMP 03/14/2017 (Exact Date)   BMI 26.60 kg/m  General:  Well developed, well nourished, no acute distress Skin:  Warm and dry Neck:  Midline trachea, normal thyroid, good ROM, no lymphadenopathy Lungs; Clear to auscultation bilaterally Breast:  No dominant palpable mass, retraction, or nipple discharge,has bilateral nipple rods  Cardiovascular: Regular rate and rhythm Abdomen:  Soft, non tender, no hepatosplenomegaly Pelvic:  External genitalia is normal in appearance, no lesions.  The vagina is normal in appearance. Urethra has no lesions or masses. The cervix is bulbous.  Uterus is felt to be normal size, shape, and contour.  No adnexal masses or tenderness noted.Bladder is non tender, no masses felt. Rectal: Good sphincter tone, no polyps, or hemorrhoids felt.  Hemoccult negative. Extremities/musculoskeletal:  No swelling or varicosities noted, no clubbing or cyanosis Psych:  No mood changes, alert and cooperative,seems happy PHQ 2 score 0.   Impression: 1. Encounter for well woman exam with routine gynecological exam   2. Screening for colorectal cancer   3. Essential hypertension   4.  Screening for diabetes mellitus   5. Encounter for vitamin deficiency screening       Plan: Check CBC,CMP,TSH and lipids,A1c and vitamin D Physical in 1 year Pap 2020 Mammogram yearly

## 2017-03-18 NOTE — Patient Instructions (Signed)
Physical in 1 year 

## 2017-03-19 LAB — VITAMIN D 25 HYDROXY (VIT D DEFICIENCY, FRACTURES): VIT D 25 HYDROXY: 38.6 ng/mL (ref 30.0–100.0)

## 2017-03-19 LAB — CBC
HEMATOCRIT: 40.8 % (ref 34.0–46.6)
Hemoglobin: 13.6 g/dL (ref 11.1–15.9)
MCH: 27.1 pg (ref 26.6–33.0)
MCHC: 33.3 g/dL (ref 31.5–35.7)
MCV: 81 fL (ref 79–97)
PLATELETS: 314 10*3/uL (ref 150–379)
RBC: 5.01 x10E6/uL (ref 3.77–5.28)
RDW: 14.5 % (ref 12.3–15.4)
WBC: 5.5 10*3/uL (ref 3.4–10.8)

## 2017-03-19 LAB — COMPREHENSIVE METABOLIC PANEL
ALK PHOS: 53 IU/L (ref 39–117)
ALT: 43 IU/L — AB (ref 0–32)
AST: 50 IU/L — AB (ref 0–40)
Albumin/Globulin Ratio: 1.8 (ref 1.2–2.2)
Albumin: 4.6 g/dL (ref 3.5–5.5)
BUN/Creatinine Ratio: 13 (ref 9–23)
BUN: 13 mg/dL (ref 6–24)
Bilirubin Total: 0.2 mg/dL (ref 0.0–1.2)
CO2: 24 mmol/L (ref 20–29)
CREATININE: 0.99 mg/dL (ref 0.57–1.00)
Calcium: 9.4 mg/dL (ref 8.7–10.2)
Chloride: 103 mmol/L (ref 96–106)
GFR calc Af Amer: 78 mL/min/{1.73_m2} (ref >59)
GFR calc non Af Amer: 68 mL/min/{1.73_m2} (ref >59)
GLOBULIN, TOTAL: 2.5 g/dL (ref 1.5–4.5)
GLUCOSE: 83 mg/dL (ref 65–99)
Potassium: 4.3 mmol/L (ref 3.5–5.2)
SODIUM: 140 mmol/L (ref 134–144)
Total Protein: 7.1 g/dL (ref 6.0–8.5)

## 2017-03-19 LAB — HEMOGLOBIN A1C
ESTIMATED AVERAGE GLUCOSE: 117 mg/dL
Hgb A1c MFr Bld: 5.7 % — ABNORMAL HIGH (ref 4.8–5.6)

## 2017-03-19 LAB — LIPID PANEL
CHOLESTEROL TOTAL: 214 mg/dL — AB (ref 100–199)
Chol/HDL Ratio: 2.9 ratio (ref 0.0–4.4)
HDL: 74 mg/dL (ref >39)
LDL Calculated: 123 mg/dL — ABNORMAL HIGH (ref 0–99)
Triglycerides: 85 mg/dL (ref 0–149)
VLDL CHOLESTEROL CAL: 17 mg/dL (ref 5–40)

## 2017-03-19 LAB — TSH: TSH: 0.504 u[IU]/mL (ref 0.450–4.500)

## 2017-03-20 ENCOUNTER — Telehealth: Payer: Self-pay | Admitting: Adult Health

## 2017-03-20 NOTE — Telephone Encounter (Signed)
Left message about labs, and that lever tests slightly elevated, decrease tylenol,advil and alcohol, decrease carbs and increase exercise, as A1c 5.7,other labs good

## 2017-04-08 DIAGNOSIS — N611 Abscess of the breast and nipple: Secondary | ICD-10-CM | POA: Diagnosis not present

## 2017-04-09 ENCOUNTER — Ambulatory Visit (HOSPITAL_COMMUNITY)
Admission: RE | Admit: 2017-04-09 | Discharge: 2017-04-09 | Disposition: A | Discharge status: Home / Self Care | Payer: BLUE CROSS/BLUE SHIELD | Source: Ambulatory Visit | Attending: Adult Health | Admitting: Adult Health

## 2017-04-09 DIAGNOSIS — Z1231 Encounter for screening mammogram for malignant neoplasm of breast: Secondary | ICD-10-CM

## 2017-04-15 ENCOUNTER — Encounter: Payer: Self-pay | Admitting: General Surgery

## 2017-04-15 ENCOUNTER — Ambulatory Visit (INDEPENDENT_AMBULATORY_CARE_PROVIDER_SITE_OTHER): Payer: BLUE CROSS/BLUE SHIELD | Admitting: General Surgery

## 2017-04-15 VITALS — BP 116/75 | HR 96 | Temp 98.7°F | Resp 18 | Ht 62.0 in | Wt 147.0 lb

## 2017-04-15 DIAGNOSIS — N611 Abscess of the breast and nipple: Secondary | ICD-10-CM

## 2017-04-15 DIAGNOSIS — N61 Mastitis without abscess: Secondary | ICD-10-CM | POA: Diagnosis not present

## 2017-04-15 DIAGNOSIS — S21039A Puncture wound without foreign body of unspecified breast, initial encounter: Principal | ICD-10-CM

## 2017-04-15 NOTE — H&P (Signed)
[facility] History and Physical  Reason for Referral: Left nipple abscess / infection  Referring Physician:  Dr. Wende CreaseGuarino   Chief Complaint    Breast Problem      [name] is a 46 y.o. female.  HPI: Alicia Wall is a 46 yo otherwise healthy patient who presented to Urgent Care with a left nipple infection treated with antibiotics in the presence of a left nipple ring. She had the nipple ring placed in March 2018, and reports no issues since that time. She reports that this area started to drain around the nipple ring, and not from the nipple, and that the drainage was purulent. It has improved since the antibiotics were given by Dr. Wende CreaseGuarino. She reports no fevers or chills. She denies any other lumps or bumps in her breast or breast/ nipple drainage.  She has a nipple ring on the right also. Her last mammogram was last year and was BIRADS 1 , and she is scheduled for one 11/28, but due to this infection it was post poned.  She has no family history of any breast cancers.   Past Medical History:  Diagnosis Date  . History of trichomoniasis   . Hypertension   . Trichomonas infection 03/14/2014    History reviewed. No pertinent surgical history. Bilateral nipple piercing   Family History  Problem Relation Age of Onset  . Heart attack Father   . Diabetes Father   . Hypertension Paternal Grandfather   . Diabetes Paternal Grandmother   . Hypertension Paternal Grandmother   . Hypertension Maternal Grandmother   . Hypertension Maternal Grandfather     Social History   Tobacco Use  . Smoking status: Former Smoker    Years: 10.00    Types: Cigarettes    Last attempt to quit: 02/23/2003    Years since quitting: 14.1  . Smokeless tobacco: Never Used  Substance Use Topics  . Alcohol use: Yes    Alcohol/week: 1.0 oz    Types: 2 Standard drinks or equivalent per week    Comment:  on weekends  . Drug use: No    Medications: I have reviewed the patient's  current medications.  Allergies as of 04/15/2017   No Known Allergies     Medication List        Accurate as of 04/15/17  1:07 PM. Always use your most recent med list.          hydrochlorothiazide 12.5 MG capsule Commonly known as:  MICROZIDE Take 1 capsule (12.5 mg total) daily by mouth.   ibuprofen 200 MG tablet Commonly known as:  ADVIL,MOTRIN Take 400 mg by mouth as needed.        ROS:  A comprehensive review of systems was negative except for: Integument/breast: positive for right and left nipple rings, drainage and tenderness around left nipple  Blood pressure 116/75, pulse 96, temperature 98.7 F (37.1 C), resp. rate 18, height 5\' 2"  (1.575 m), weight 147 lb (66.7 kg), last menstrual period 04/08/2017. Physical Exam  Constitutional: She is oriented to person, place, and time and well-developed, well-nourished, and in no distress.  HENT:  Head: Normocephalic.  Eyes: Pupils are equal, round, and reactive to light.  Neck: Normal range of motion.  Cardiovascular: Normal rate and regular rhythm.  Pulmonary/Chest: Effort normal and breath sounds normal. Right breast exhibits no mass, no nipple discharge, no skin change and no tenderness. Left breast exhibits tenderness. Left breast exhibits no mass, no nipple discharge and no skin change.  No masses in the left or right breast, right axilla negative, left axilla with small <1cm palpable lymph node, b/l nipple rings through the exterior nipple, drainage from the nipple ring on the left with epithelization of the ring under the skin on one side  Abdominal: Soft. She exhibits no distension. There is no tenderness.  Musculoskeletal: Normal range of motion. She exhibits no edema.  Neurological: She is alert and oriented to person, place, and time.  Skin: Skin is warm and dry.  Psychiatric: Mood, memory, affect and judgment normal.  Vitals reviewed.   Results: None  Assessment & Plan:  Alicia Wall is a 46 y.o.  female with a left nipple abscess/ infection related to her nipple ring and the nipple ring is now epithelized and part of it is completed under the skin of the nipple. This needs to be removed to allow for more adequate drainage and to prevent it from needing additional surgery to remove in the future given that it is no longer in the correct position. No signs of any breast cancer, and no true nipple discharge as this discharge is from the ends of the nipple ring bar and never from the nipple itself.  -Minor procedure tomorrow; plan for lidocaine to numb the area  -Do not recommend replacement for 3+ months  -Needs to get her mammogram for this year as normally planned -Adenopathy on the left, small and likely is related to infection  All questions were answered to the satisfaction of the patient.  The risk and benefits of removal of the left nipple ring were discussed including but not limited to bleeding, discomfort, and infection.  After careful consideration, Alicia Wall has decided to proceed.    Alicia Wall 04/15/2017, 1:07 PM       

## 2017-04-15 NOTE — Progress Notes (Signed)
[facility] History and Physical  Reason for Referral: Left nipple abscess / infection  Referring Physician:  Dr. Wende CreaseGuarino   Chief Complaint    Breast Problem      Alicia Wall Schuermann is a 46 y.o. female.  HPI: Ms. Alicia Wall is a 46 yo otherwise healthy patient who presented to Urgent Care with a left nipple infection treated with antibiotics in the presence of a left nipple ring. She had the nipple ring placed in March 2018, and reports no issues since that time. She reports that this area started to drain around the nipple ring, and not from the nipple, and that the drainage was purulent. It has improved since the antibiotics were given by Dr. Wende CreaseGuarino. She reports no fevers or chills. She denies any other lumps or bumps in her breast or breast/ nipple drainage.  She has a nipple ring on the right also. Her last mammogram was last year and was BIRADS 1 , and she is scheduled for one 11/28, but due to this infection it was post poned.  She has no family history of any breast cancers.   Past Medical History:  Diagnosis Date  . History of trichomoniasis   . Hypertension   . Trichomonas infection 03/14/2014    History reviewed. No pertinent surgical history. Bilateral nipple piercing   Family History  Problem Relation Age of Onset  . Heart attack Father   . Diabetes Father   . Hypertension Paternal Grandfather   . Diabetes Paternal Grandmother   . Hypertension Paternal Grandmother   . Hypertension Maternal Grandmother   . Hypertension Maternal Grandfather     Social History   Tobacco Use  . Smoking status: Former Smoker    Years: 10.00    Types: Cigarettes    Last attempt to quit: 02/23/2003    Years since quitting: 14.1  . Smokeless tobacco: Never Used  Substance Use Topics  . Alcohol use: Yes    Alcohol/week: 1.0 oz    Types: 2 Standard drinks or equivalent per week    Comment:  on weekends  . Drug use: No    Medications: I have reviewed the patient's  current medications.  Allergies as of 04/15/2017   No Known Allergies     Medication List        Accurate as of 04/15/17  1:07 PM. Always use your most recent med list.          hydrochlorothiazide 12.5 MG capsule Commonly known as:  MICROZIDE Take 1 capsule (12.5 mg total) daily by mouth.   ibuprofen 200 MG tablet Commonly known as:  ADVIL,MOTRIN Take 400 mg by mouth as needed.        ROS:  A comprehensive review of systems was negative except for: Integument/breast: positive for right and left nipple rings, drainage and tenderness around left nipple  Blood pressure 116/75, pulse 96, temperature 98.7 F (37.1 C), resp. rate 18, height 5\' 2"  (1.575 m), weight 147 lb (66.7 kg), last menstrual period 04/08/2017. Physical Exam  Constitutional: She is oriented to person, place, and time and well-developed, well-nourished, and in no distress.  HENT:  Head: Normocephalic.  Eyes: Pupils are equal, round, and reactive to light.  Neck: Normal range of motion.  Cardiovascular: Normal rate and regular rhythm.  Pulmonary/Chest: Effort normal and breath sounds normal. Right breast exhibits no mass, no nipple discharge, no skin change and no tenderness. Left breast exhibits tenderness. Left breast exhibits no mass, no nipple discharge and no skin change.  No masses in the left or right breast, right axilla negative, left axilla with small <1cm palpable lymph node, b/l nipple rings through the exterior nipple, drainage from the nipple ring on the left with epithelization of the ring under the skin on one side  Abdominal: Soft. She exhibits no distension. There is no tenderness.  Musculoskeletal: Normal range of motion. She exhibits no edema.  Neurological: She is alert and oriented to person, place, and time.  Skin: Skin is warm and dry.  Psychiatric: Mood, memory, affect and judgment normal.  Vitals reviewed.   Results: None  Assessment & Plan:  Alicia Wall is a 46 y.o.  female with a left nipple abscess/ infection related to her nipple ring and the nipple ring is now epithelized and part of it is completed under the skin of the nipple. This needs to be removed to allow for more adequate drainage and to prevent it from needing additional surgery to remove in the future given that it is no longer in the correct position. No signs of any breast cancer, and no true nipple discharge as this discharge is from the ends of the nipple ring bar and never from the nipple itself.  -Minor procedure tomorrow; plan for lidocaine to numb the area  -Do not recommend replacement for 3+ months  -Needs to get her mammogram for this year as normally planned -Adenopathy on the left, small and likely is related to infection  All questions were answered to the satisfaction of the patient.  The risk and benefits of removal of the left nipple ring were discussed including but not limited to bleeding, discomfort, and infection.  After careful consideration, Alicia Wall has decided to proceed.    Lucretia RoersLindsay C Zaidee Rion 04/15/2017, 1:07 PM

## 2017-04-15 NOTE — Patient Instructions (Signed)
Skin Abscess A skin abscess is an infected area on or under your skin that contains pus and other material. An abscess can happen almost anywhere on your body. Some abscesses break open (rupture) on their own. Most continue to get worse unless they are treated. The infection can spread deeper into the body and into your blood, which can make you feel sick. Treatment usually involves draining the abscess. Follow these instructions at home: Abscess Care  If you have an abscess that has not drained, place a warm, clean, wet washcloth over the abscess several times a day. Do this as told by your doctor.  Follow instructions from your doctor about how to take care of your abscess. Make sure you: ? Cover the abscess with a bandage (dressing). ? Change your bandage or gauze as told by your doctor. ? Wash your hands with soap and water before you change the bandage or gauze. If you cannot use soap and water, use hand sanitizer.  Check your abscess every day for signs that the infection is getting worse. Check for: ? More redness, swelling, or pain. ? More fluid or blood. ? Warmth. ? More pus or a bad smell. Medicines   Take over-the-counter and prescription medicines only as told by your doctor.  If you were prescribed an antibiotic medicine, take it as told by your doctor. Do not stop taking the antibiotic even if you start to feel better. General instructions  To avoid spreading the infection: ? Do not share personal care items, towels, or hot tubs with others. ? Avoid making skin-to-skin contact with other people.  Keep all follow-up visits as told by your doctor. This is important. Contact a doctor if:  You have more redness, swelling, or pain around your abscess.  You have more fluid or blood coming from your abscess.  Your abscess feels warm when you touch it.  You have more pus or a bad smell coming from your abscess.  You have a fever.  Your muscles ache.  You have  chills.  You feel sick. Get help right away if:  You have very bad (severe) pain.  You see red streaks on your skin spreading away from the abscess. This information is not intended to replace advice given to you by your health care provider. Make sure you discuss any questions you have with your health care provider. Document Released: 10/16/2007 Document Revised: 12/24/2015 Document Reviewed: 03/08/2015 Elsevier Interactive Patient Education  2018 Elsevier Inc.  

## 2017-04-16 ENCOUNTER — Ambulatory Visit (HOSPITAL_COMMUNITY)
Admission: RE | Admit: 2017-04-16 | Discharge: 2017-04-16 | Disposition: A | Discharge status: Home / Self Care | Payer: BLUE CROSS/BLUE SHIELD | Source: Ambulatory Visit | Attending: General Surgery | Admitting: General Surgery

## 2017-04-16 ENCOUNTER — Encounter (HOSPITAL_COMMUNITY)
Admission: RE | Disposition: A | Discharge status: Home / Self Care | Payer: Self-pay | Source: Ambulatory Visit | Attending: General Surgery

## 2017-04-16 DIAGNOSIS — Z87891 Personal history of nicotine dependence: Secondary | ICD-10-CM | POA: Diagnosis not present

## 2017-04-16 DIAGNOSIS — S21039A Puncture wound without foreign body of unspecified breast, initial encounter: Secondary | ICD-10-CM

## 2017-04-16 DIAGNOSIS — Z791 Long term (current) use of non-steroidal anti-inflammatories (NSAID): Secondary | ICD-10-CM | POA: Diagnosis not present

## 2017-04-16 DIAGNOSIS — I10 Essential (primary) hypertension: Secondary | ICD-10-CM | POA: Diagnosis not present

## 2017-04-16 DIAGNOSIS — N61 Mastitis without abscess: Secondary | ICD-10-CM | POA: Diagnosis not present

## 2017-04-16 DIAGNOSIS — N611 Abscess of the breast and nipple: Secondary | ICD-10-CM | POA: Insufficient documentation

## 2017-04-16 DIAGNOSIS — M795 Residual foreign body in soft tissue: Secondary | ICD-10-CM | POA: Diagnosis not present

## 2017-04-16 HISTORY — PX: IRRIGATION AND DEBRIDEMENT ABSCESS: SHX5252

## 2017-04-16 SURGERY — MINOR INCISION AND DRAINAGE OF ABSCESS
Anesthesia: LOCAL | Laterality: Left

## 2017-04-16 MED ORDER — OXYCODONE HCL 5 MG PO TABS: 5.0000 mg | ORAL_TABLET | Freq: Four times a day (QID) | ORAL | 0 refills | Status: DC | PRN

## 2017-04-16 MED ORDER — LIDOCAINE HCL (PF) 1 % IJ SOLN
INTRAMUSCULAR | Status: AC
  Filled 2017-04-16: qty 30

## 2017-04-16 MED ORDER — SILVER NITRATE-POT NITRATE 75-25 % EX MISC
CUTANEOUS | Status: AC
  Filled 2017-04-16: qty 1

## 2017-04-16 MED ORDER — SILVER NITRATE-POT NITRATE 75-25 % EX MISC
CUTANEOUS | Status: DC | PRN
  Administered 2017-04-16: 3

## 2017-04-16 MED ORDER — LIDOCAINE HCL (PF) 1 % IJ SOLN
INTRAMUSCULAR | Status: DC | PRN
  Administered 2017-04-16: 2 mL

## 2017-04-16 SURGICAL SUPPLY — 22 items
CHLORAPREP W/TINT 10.5 ML (MISCELLANEOUS) ×2 IMPLANT
CLOTH BEACON ORANGE TIMEOUT ST (SAFETY) ×2 IMPLANT
DECANTER SPIKE VIAL GLASS SM (MISCELLANEOUS) ×2 IMPLANT
DERMABOND ADVANCED (GAUZE/BANDAGES/DRESSINGS) ×1
DERMABOND ADVANCED .7 DNX12 (GAUZE/BANDAGES/DRESSINGS) ×1 IMPLANT
DRAPE PROXIMA HALF (DRAPES) ×2 IMPLANT
ELECT REM PT RETURN 9FT ADLT (ELECTROSURGICAL) ×2
ELECTRODE REM PT RTRN 9FT ADLT (ELECTROSURGICAL) ×1 IMPLANT
GLOVE BIO SURGEON STRL SZ 6.5 (GLOVE) ×2 IMPLANT
GLOVE BIOGEL PI IND STRL 6.5 (GLOVE) ×1 IMPLANT
GLOVE BIOGEL PI IND STRL 7.0 (GLOVE) ×1 IMPLANT
GLOVE BIOGEL PI INDICATOR 6.5 (GLOVE) ×1
GLOVE BIOGEL PI INDICATOR 7.0 (GLOVE) ×1
GOWN STRL REUS W/TWL LRG LVL3 (GOWN DISPOSABLE) ×2 IMPLANT
NEEDLE HYPO 25X1 1.5 SAFETY (NEEDLE) ×2 IMPLANT
PENCIL HANDSWITCHING (ELECTRODE) ×2 IMPLANT
SPONGE GAUZE 2X2 8PLY STRL LF (GAUZE/BANDAGES/DRESSINGS) ×2 IMPLANT
SUT MNCRL AB 4-0 PS2 18 (SUTURE) ×2 IMPLANT
SUT VIC AB 3-0 SH 27 (SUTURE) ×1
SUT VIC AB 3-0 SH 27X BRD (SUTURE) ×1 IMPLANT
SYR CONTROL 10ML LL (SYRINGE) ×2 IMPLANT
TOWEL OR 17X26 4PK STRL BLUE (TOWEL DISPOSABLE) ×2 IMPLANT

## 2017-04-16 NOTE — Op Note (Signed)
[facility] Operative Note  04/16/17  Preoperative Diagnosis: Infected left nipple with abscess and nipple piercing in place    Postoperative Diagnosis: Same   Procedure(s) Performed: Removal of nipple ring of left nipple and silver nitrate to granulation around edge of ring entry and exit site    Surgeon: Leatrice JewelsLindsay C. Henreitta LeberBridges, MD   Assistants: No qualified resident was available   Anesthesia: 1% lidocaine plain     Specimens: None   Estimated Blood Loss: Minimal   Blood Replacement: None    Complications: None   Wound Class: Dirty   Operative Indications: Ms. Alicia Wall is a 46 yo with an infected left breast nipple through the area of her nipple ring with the end of the nipple ring enclosed by epithelized skin, making it buried and unable to be removed in the office. This needed to be removed due to her infection and due to it being buried in the nipple.  After a discussion of the risk and benefits of bleeding, infection, and chronic piercing site, the patient opted to proceed.   Findings: Epithelized skin over one end of nipple ring. Granulation at the edges.    Procedure: The patient was taken to the minor procedure room and a time out was performed. The left breast was prepped and draped in the sterile fashion.  Lidocaine 1% plain was injected into the left nipple. A scalpel was used to incise the area of granulation over the end of the nipple ring. The ball at the end was identified and pulled out of the nipple tissue and twisted off. This freed the nipple ring to be removed.  It was removed in its entirety.  There was granulation tissue at the edges of point sites entry and exit, and this was cauterize   Final inspection revealed acceptable hemostasis. All counts were correct at the end of the case. The patient was discharged in stable condition.    Algis GreenhouseLindsay Helayna Dun, MD [facility] [address] [place], [place]  [phone] (office)

## 2017-04-16 NOTE — Discharge Instructions (Signed)
Discharge Instructions: Shower per your regular routine. Take tylenol and ibuprofen as needed for pain control, alternating every 4-6 hours.  Take Roxicodone for breakthrough pain. Take colace for constipation related to narcotic pain medication. Dry gauze over nipple as needed and at least once daily.  Expect drainage.   Wound Care, Adult Taking care of your wound properly can help to prevent pain and infection. It can also help your wound to heal more quickly. How is this treated? Wound care  Follow instructions from your health care provider about how to take care of your wound. Make sure you: ? Wash your hands with soap and water before you change the bandage (dressing). If soap and water are not available, use hand sanitizer. ? Change your dressing as told by your health care provider. ? Leave stitches (sutures), skin glue, or adhesive strips in place. These skin closures may need to stay in place for 2 weeks or longer. If adhesive strip edges start to loosen and curl up, you may trim the loose edges. Do not remove adhesive strips completely unless your health care provider tells you to do that.  Check your wound area every day for signs of infection. Check for: ? More redness, swelling, or pain. ? More fluid or blood. ? Warmth. ? Pus or a bad smell.  Ask your health care provider if you should clean the wound with mild soap and water. Doing this may include: ? Using a clean towel to pat the wound dry after cleaning it. Do not rub or scrub the wound. ? Applying a cream or ointment. Do this only as told by your health care provider. ? Covering the incision with a clean dressing.  Ask your health care provider when you can leave the wound uncovered. Medicines   If you were prescribed an antibiotic medicine, cream, or ointment, take or use the antibiotic as told by your health care provider. Do not stop taking or using the antibiotic even if your condition improves.  Take  over-the-counter and prescription medicines only as told by your health care provider. If you were prescribed pain medicine, take it at least 30 minutes before doing any wound care or as told by your health care provider. General instructions  Return to your normal activities as told by your health care provider. Ask your health care provider what activities are safe.  Do not scratch or pick at the wound.  Keep all follow-up visits as told by your health care provider. This is important.  Eat a diet that includes protein, vitamin A, vitamin C, and other nutrient-rich foods. These help the wound heal: ? Protein-rich foods include meat, dairy, beans, nuts, and other sources. ? Vitamin A-rich foods include carrots and dark green, leafy vegetables. ? Vitamin C-rich foods include citrus, tomatoes, and other fruits and vegetables. ? Nutrient-rich foods have protein, carbohydrates, fat, vitamins, or minerals. Eat a variety of healthy foods including vegetables, fruits, and whole grains. Contact a health care provider if:  You received a tetanus shot and you have swelling, severe pain, redness, or bleeding at the injection site.  Your pain is not controlled with medicine.  You have more redness, swelling, or pain around the wound.  You have more fluid or blood coming from the wound.  Your wound feels warm to the touch.  You have pus or a bad smell coming from the wound.  You have a fever or chills.  You are nauseous or you vomit.  You are dizzy. Get  help right away if:  You have a red streak going away from your wound.  The edges of the wound open up and separate.  Your wound is bleeding and the bleeding does not stop with gentle pressure.  You have a rash.  You faint.  You have trouble breathing. This information is not intended to replace advice given to you by your health care provider. Make sure you discuss any questions you have with your health care provider. Document  Released: 02/06/2008 Document Revised: 12/27/2015 Document Reviewed: 11/14/2015 Elsevier Interactive Patient Education  2017 [employer]

## 2017-04-18 ENCOUNTER — Encounter (HOSPITAL_COMMUNITY): Payer: Self-pay | Admitting: General Surgery

## 2017-04-21 ENCOUNTER — Emergency Department (HOSPITAL_COMMUNITY)
Admission: EM | Admit: 2017-04-21 | Discharge: 2017-04-21 | Disposition: A | Discharge status: Home / Self Care | Payer: BLUE CROSS/BLUE SHIELD | Attending: Emergency Medicine | Admitting: Emergency Medicine

## 2017-04-21 ENCOUNTER — Encounter (HOSPITAL_COMMUNITY): Payer: Self-pay | Admitting: Cardiology

## 2017-04-21 DIAGNOSIS — X58XXXA Exposure to other specified factors, initial encounter: Secondary | ICD-10-CM | POA: Insufficient documentation

## 2017-04-21 DIAGNOSIS — I10 Essential (primary) hypertension: Secondary | ICD-10-CM | POA: Insufficient documentation

## 2017-04-21 DIAGNOSIS — Y999 Unspecified external cause status: Secondary | ICD-10-CM | POA: Diagnosis not present

## 2017-04-21 DIAGNOSIS — Y93H1 Activity, digging, shoveling and raking: Secondary | ICD-10-CM | POA: Insufficient documentation

## 2017-04-21 DIAGNOSIS — Z87891 Personal history of nicotine dependence: Secondary | ICD-10-CM | POA: Insufficient documentation

## 2017-04-21 DIAGNOSIS — Y929 Unspecified place or not applicable: Secondary | ICD-10-CM | POA: Insufficient documentation

## 2017-04-21 DIAGNOSIS — S39012A Strain of muscle, fascia and tendon of lower back, initial encounter: Secondary | ICD-10-CM | POA: Diagnosis not present

## 2017-04-21 DIAGNOSIS — Z79899 Other long term (current) drug therapy: Secondary | ICD-10-CM | POA: Insufficient documentation

## 2017-04-21 MED ORDER — HYDROCODONE-ACETAMINOPHEN 5-325 MG PO TABS
1.0000 | ORAL_TABLET | Freq: Once | ORAL | Status: AC
  Administered 2017-04-21: 1 via ORAL
  Filled 2017-04-21: qty 1

## 2017-04-21 MED ORDER — KETOROLAC TROMETHAMINE 60 MG/2ML IM SOLN
60.0000 mg | Freq: Once | INTRAMUSCULAR | Status: AC
  Administered 2017-04-21: 60 mg via INTRAMUSCULAR
  Filled 2017-04-21: qty 2

## 2017-04-21 MED ORDER — IBUPROFEN 800 MG PO TABS: 800.0000 mg | ORAL_TABLET | Freq: Three times a day (TID) | ORAL | 0 refills | Status: DC

## 2017-04-21 MED ORDER — DIAZEPAM 5 MG PO TABS
5.0000 mg | ORAL_TABLET | Freq: Once | ORAL | Status: AC
  Administered 2017-04-21: 5 mg via ORAL
  Filled 2017-04-21: qty 1

## 2017-04-21 MED ORDER — METHOCARBAMOL 500 MG PO TABS
500.0000 mg | ORAL_TABLET | Freq: Two times a day (BID) | ORAL | 0 refills | Status: DC
Start: 2017-04-21 — End: 2018-03-19

## 2017-04-21 NOTE — ED Triage Notes (Signed)
Woke up this morning with lower back pain that radiates into right hip.  Pt states she was shoveling snow yesterday.

## 2017-04-21 NOTE — ED Provider Notes (Signed)
[facility] Provider Note   CSN: [id number] Arrival date & time: 04/21/17  1725     History   Chief Complaint No chief complaint on file.   HPI Alicia Wall is a 46 y.o. female.  Patient presents with right-sided back pain that radiates into her right hip and leg.  Denies any trauma but she was shoveling snow yesterday.  States she has not had any problems with her back or leg before.  She has been using icy hot at home as well as naproxen without relief.  She has pain with ambulation.  No focal weakness, numbness or tingling.  No bowel or bladder incontinence.  No fever or vomiting.  No history of IV drug use or cancer.  No dysuria or hematuria   The history is provided by the patient.    Past Medical History:  Diagnosis Date  . History of trichomoniasis   . Hypertension   . Trichomonas infection 03/14/2014    Patient Active Problem List   Diagnosis Date Noted  . Abscess of left nipple   . Infected pierced nipple   . Screening for diabetes mellitus 03/18/2017  . Encounter for vitamin deficiency screening 03/18/2017  . Trichomonas infection 03/14/2014  . Essential hypertension 02/22/2013    Past Surgical History:  Procedure Laterality Date  . IRRIGATION AND DEBRIDEMENT ABSCESS Left 04/16/2017   Procedure: MINOR REMOVAL OF FOREIGN BODY NIPPLE;  Surgeon: Lucretia RoersBridges, Lindsay C, MD;  Location: [facility];  Service: General;  Laterality: Left;  office LM for pt to arrive at 9:15    OB History    Gravida Para Term Preterm AB Living   0 0 0 0 0 0   SAB TAB Ectopic Multiple Live Births   0 0 0 0         Home Medications    Prior to Admission medications   Medication Sig Start Date End Date Taking? Authorizing Provider  hydrochlorothiazide (MICROZIDE) 12.5 MG capsule Take 1 capsule (12.5 mg total) daily by mouth. 03/18/17   Adline PotterGriffin, Jennifer A, NP  ibuprofen (ADVIL,MOTRIN) 200 MG tablet Take 400 mg by mouth as needed.    [provider]    oxyCODONE (ROXICODONE) 5 MG immediate release tablet Take 1 tablet (5 mg total) by mouth every 6 (six) hours as needed for severe pain or breakthrough pain. 04/16/17 04/16/18  Lucretia RoersBridges, Lindsay C, MD    Family History Family History  Problem Relation Age of Onset  . Heart attack Father   . Diabetes Father   . Hypertension Paternal Grandfather   . Diabetes Paternal Grandmother   . Hypertension Paternal Grandmother   . Hypertension Maternal Grandmother   . Hypertension Maternal Grandfather     Social History Social History   Tobacco Use  . Smoking status: Former Smoker    Years: 10.00    Types: Cigarettes    Last attempt to quit: 02/23/2003    Years since quitting: 14.1  . Smokeless tobacco: Never Used  Substance Use Topics  . Alcohol use: Yes    Alcohol/week: 1.0 oz    Types: 2 Standard drinks or equivalent per week    Comment:  on weekends  . Drug use: No     Allergies   Patient has no known allergies.   Review of Systems Review of Systems  Constitutional: Negative for activity change, appetite change and fever.  HENT: Negative for congestion and rhinorrhea.   Respiratory: Negative for cough, chest tightness and shortness of breath.  Cardiovascular: Negative for chest pain.  Gastrointestinal: Negative for abdominal pain, nausea and vomiting.  Genitourinary: Negative for dysuria, hematuria, vaginal bleeding and vaginal discharge.  Musculoskeletal: Positive for arthralgias, back pain and myalgias.  Skin: Negative for rash.  Neurological: Negative for dizziness, weakness and headaches.    all other systems are negative except as noted in the HPI and PMH.    Physical Exam Updated Vital Signs BP (!) 154/103   Pulse 95   Temp (!) 97 F (36.1 C) (Temporal)   Resp 16   LMP 04/08/2017   SpO2 100%   Physical Exam  Constitutional: She is oriented to person, place, and time. She appears well-developed and well-nourished. No distress.  uncomfortable  HENT:  Head:  Normocephalic and atraumatic.  Mouth/Throat: Oropharynx is clear and moist. No oropharyngeal exudate.  Eyes: Conjunctivae and EOM are normal. Pupils are equal, round, and reactive to light.  Neck: Normal range of motion. Neck supple.  No meningismus.  Cardiovascular: Normal rate, regular rhythm, normal heart sounds and intact distal pulses.  No murmur heard. Pulmonary/Chest: Effort normal and breath sounds normal. No respiratory distress.  Abdominal: Soft. There is no tenderness. There is no rebound and no guarding.  Musculoskeletal: Normal range of motion. She exhibits tenderness. She exhibits no edema.  Right paraspinal lumbar tenderness, no midline tenderness 5/5 strength in bilateral lower extremities. Ankle plantar and dorsiflexion intact. Great toe extension intact bilaterally. +2 DP and PT pulses. +2 patellar reflexes bilaterally. antalgic gait.   Neurological: She is alert and oriented to person, place, and time. No cranial nerve deficit. She exhibits normal muscle tone. Coordination normal.  No ataxia on finger to nose bilaterally. No pronator drift. 5/5 strength throughout. CN 2-12 intact.Equal grip strength. Sensation intact.   Skin: Skin is warm. Capillary refill takes less than 2 seconds.  Psychiatric: She has a normal mood and affect. Her behavior is normal.  Nursing note and vitals reviewed.    ED Treatments / Results  Labs (all labs ordered are listed, but only abnormal results are displayed) Labs Reviewed - No data to display  EKG  EKG Interpretation None       Radiology No results found.  Procedures Procedures (including critical care time)  Medications Ordered in ED Medications  ketorolac (TORADOL) injection 60 mg (not administered)  diazepam (VALIUM) tablet 5 mg (not administered)  HYDROcodone-acetaminophen (NORCO/VICODIN) 5-325 MG per tablet 1 tablet (not administered)     Initial Impression / Assessment and Plan / ED Course  I have reviewed the  triage vital signs and the nursing notes.  Pertinent labs & imaging results that were available during my care of the patient were reviewed by me and considered in my medical decision making (see chart for details).    Right-sided low back and leg pain after shoveling snow yesterday.  No neurological deficits.  Low suspicion for cord compression or cauda equina.  Patient will be treated supportively for musculoskeletal back pain and leg pain with anti-inflammatories and muscle relaxers.  Patient improved in the ED.  She is able to tolerate p.o. and is ambulatory.  Will treat for muscular skeletal back pain.  Low suspicion for cord compression or cauda equina.  Follow-up with PCP.  Return precautions discussed.  Final Clinical Impressions(s) / ED Diagnoses   Final diagnoses:  Strain of lumbar region, initial encounter    ED Discharge Orders    None       Kimberlie Csaszar, Jeannett SeniorStephen, MD 04/21/17 1942

## 2017-04-21 NOTE — Discharge Instructions (Signed)
Take the antiinflammatories and muscle relaxers as prescribed. Follow up with your doctor. Return to the ED if you develop new or worsening symptoms.

## 2017-04-21 NOTE — ED Notes (Signed)
Pt alert & oriented x4, stable gait. Patient given discharge instructions, paperwork & prescription(s). Patient verbalized understanding. Pt left department in wheelchair escorted by staff. Pt left w/ no further questions. 

## 2017-04-23 ENCOUNTER — Encounter (HOSPITAL_COMMUNITY): Payer: Self-pay | Admitting: *Deleted

## 2017-04-23 ENCOUNTER — Other Ambulatory Visit: Payer: Self-pay

## 2017-04-23 ENCOUNTER — Emergency Department (HOSPITAL_COMMUNITY)
Admission: EM | Admit: 2017-04-23 | Discharge: 2017-04-23 | Disposition: A | Discharge status: Home / Self Care | Payer: BLUE CROSS/BLUE SHIELD | Attending: Emergency Medicine | Admitting: Emergency Medicine

## 2017-04-23 DIAGNOSIS — Y939 Activity, unspecified: Secondary | ICD-10-CM | POA: Diagnosis not present

## 2017-04-23 DIAGNOSIS — S39012A Strain of muscle, fascia and tendon of lower back, initial encounter: Secondary | ICD-10-CM | POA: Diagnosis not present

## 2017-04-23 DIAGNOSIS — X500XXA Overexertion from strenuous movement or load, initial encounter: Secondary | ICD-10-CM | POA: Diagnosis not present

## 2017-04-23 DIAGNOSIS — Y929 Unspecified place or not applicable: Secondary | ICD-10-CM | POA: Insufficient documentation

## 2017-04-23 DIAGNOSIS — Z79899 Other long term (current) drug therapy: Secondary | ICD-10-CM | POA: Insufficient documentation

## 2017-04-23 DIAGNOSIS — I10 Essential (primary) hypertension: Secondary | ICD-10-CM | POA: Insufficient documentation

## 2017-04-23 DIAGNOSIS — M62830 Muscle spasm of back: Secondary | ICD-10-CM

## 2017-04-23 DIAGNOSIS — Y999 Unspecified external cause status: Secondary | ICD-10-CM | POA: Insufficient documentation

## 2017-04-23 DIAGNOSIS — Z87891 Personal history of nicotine dependence: Secondary | ICD-10-CM | POA: Diagnosis not present

## 2017-04-23 MED ORDER — HYDROCODONE-ACETAMINOPHEN 5-325 MG PO TABS
2.0000 | ORAL_TABLET | Freq: Once | ORAL | Status: AC
  Administered 2017-04-23: 2 via ORAL
  Filled 2017-04-23: qty 2

## 2017-04-23 MED ORDER — ONDANSETRON HCL 4 MG PO TABS
4.0000 mg | ORAL_TABLET | Freq: Once | ORAL | Status: AC
  Administered 2017-04-23: 4 mg via ORAL
  Filled 2017-04-23: qty 1

## 2017-04-23 MED ORDER — HYDROCODONE-ACETAMINOPHEN 5-325 MG PO TABS: 1.0000 | ORAL_TABLET | ORAL | 0 refills | Status: DC | PRN

## 2017-04-23 MED ORDER — DIAZEPAM 5 MG PO TABS
5.0000 mg | ORAL_TABLET | Freq: Once | ORAL | Status: AC
  Administered 2017-04-23: 5 mg via ORAL
  Filled 2017-04-23: qty 1

## 2017-04-23 MED ORDER — DEXAMETHASONE SODIUM PHOSPHATE 10 MG/ML IJ SOLN
10.0000 mg | Freq: Once | INTRAMUSCULAR | Status: AC
  Administered 2017-04-23: 10 mg via INTRAMUSCULAR
  Filled 2017-04-23: qty 1

## 2017-04-23 NOTE — ED Provider Notes (Signed)
[facility] Provider Note   CSN: [id number] Arrival date & time: 04/23/17  [id number]     History   Chief Complaint Chief Complaint  Patient presents with  . Back Pain    HPI Alicia Wall is a 46 y.o. female.  Pt was removing snow from walkway and injured back on 12/10. Pt treated in the ED, but states pain is getting worse.    Back Pain   This is a recurrent problem. The current episode started more than 2 days ago. The problem has been gradually worsening. The pain is associated with lifting heavy objects and twisting. The pain is present in the lumbar spine. The quality of the pain is described as burning, aching and stabbing. The pain radiates to the right thigh. The pain is moderate. The symptoms are aggravated by certain positions. The pain is the same all the time. Pertinent negatives include no chest pain, no fever, no abdominal pain, no bowel incontinence, no perianal numbness, no bladder incontinence, no dysuria and no paresthesias. She has tried muscle relaxants (nsaid) for the symptoms.    Past Medical History:  Diagnosis Date  . History of trichomoniasis   . Hypertension   . Trichomonas infection 03/14/2014    Patient Active Problem List   Diagnosis Date Noted  . Abscess of left nipple   . Infected pierced nipple   . Screening for diabetes mellitus 03/18/2017  . Encounter for vitamin deficiency screening 03/18/2017  . Trichomonas infection 03/14/2014  . Essential hypertension 02/22/2013    Past Surgical History:  Procedure Laterality Date  . IRRIGATION AND DEBRIDEMENT ABSCESS Left 04/16/2017   Procedure: MINOR REMOVAL OF FOREIGN BODY NIPPLE;  Surgeon: Lucretia RoersBridges, Lindsay C, MD;  Location: [facility];  Service: General;  Laterality: Left;  office LM for pt to arrive at 9:15    OB History    Gravida Para Term Preterm AB Living   0 0 0 0 0 0   SAB TAB Ectopic Multiple Live Births   0 0 0 0         Home Medications    Prior to  Admission medications   Medication Sig Start Date End Date Taking? Authorizing Provider  cholecalciferol (VITAMIN D) 1000 units tablet Take 1,000 Units by mouth daily.    [provider]  hydrochlorothiazide (MICROZIDE) 12.5 MG capsule Take 1 capsule (12.5 mg total) daily by mouth. 03/18/17   Adline PotterGriffin, Jennifer A, NP  ibuprofen (ADVIL,MOTRIN) 800 MG tablet Take 1 tablet (800 mg total) by mouth 3 (three) times daily. 04/21/17   Rancour, Jeannett SeniorStephen, MD  methocarbamol (ROBAXIN) 500 MG tablet Take 1 tablet (500 mg total) by mouth 2 (two) times daily. 04/21/17   Rancour, Jeannett SeniorStephen, MD  Multiple Vitamin (MULTIVITAMIN WITH MINERALS) TABS tablet Take 1 tablet by mouth daily.    [provider]  naproxen sodium (ALEVE) 220 MG tablet Take 440 mg by mouth daily as needed (for pain\).    [provider]  oxyCODONE (ROXICODONE) 5 MG immediate release tablet Take 1 tablet (5 mg total) by mouth every 6 (six) hours as needed for severe pain or breakthrough pain. Patient not taking: Reported on 04/21/2017 04/16/17 04/16/18  Lucretia RoersBridges, Lindsay C, MD  sulfamethoxazole-trimethoprim (BACTRIM DS,SEPTRA DS) 800-160 MG tablet Take 2 tablets by mouth 2 (two) times daily. 04/08/17   [provider]    Family History Family History  Problem Relation Age of Onset  . Heart attack Father   . Diabetes Father   .  Hypertension Paternal Grandfather   . Diabetes Paternal Grandmother   . Hypertension Paternal Grandmother   . Hypertension Maternal Grandmother   . Hypertension Maternal Grandfather     Social History Social History   Tobacco Use  . Smoking status: Former Smoker    Years: 10.00    Types: Cigarettes    Last attempt to quit: 02/23/2003    Years since quitting: 14.1  . Smokeless tobacco: Never Used  Substance Use Topics  . Alcohol use: Yes    Alcohol/week: 1.0 oz    Types: 2 Standard drinks or equivalent per week    Comment:  on weekends  . Drug use: No     Allergies     Patient has no known allergies.   Review of Systems Review of Systems  Constitutional: Negative for activity change and fever.       All ROS Neg except as noted in HPI  HENT: Negative for nosebleeds.   Eyes: Negative for photophobia and discharge.  Respiratory: Negative for cough, shortness of breath and wheezing.   Cardiovascular: Negative for chest pain and palpitations.  Gastrointestinal: Negative for abdominal pain, blood in stool and bowel incontinence.  Genitourinary: Negative for bladder incontinence, dysuria, frequency and hematuria.  Musculoskeletal: Positive for back pain. Negative for arthralgias and neck pain.  Skin: Negative.   Neurological: Negative for dizziness, seizures, speech difficulty and paresthesias.  Psychiatric/Behavioral: Negative for confusion and hallucinations.     Physical Exam Updated Vital Signs BP (!) 157/98 (BP Location: Right Arm)   Pulse 81   Temp 98 F (36.7 C) (Oral)   Resp 18   Ht 5\' 2"  (1.575 m)   Wt 66.7 kg (147 lb)   LMP 04/08/2017   SpO2 100%   BMI 26.89 kg/m   Physical Exam  Constitutional: She is oriented to person, place, and time. She appears well-developed and well-nourished.  Non-toxic appearance.  HENT:  Head: Normocephalic.  Right Ear: Tympanic membrane and external ear normal.  Left Ear: Tympanic membrane and external ear normal.  Eyes: EOM and lids are normal. Pupils are equal, round, and reactive to light.  Neck: Normal range of motion. Neck supple. Carotid bruit is not present.  Cardiovascular: Normal rate, regular rhythm, normal heart sounds, intact distal pulses and normal pulses.  Pulmonary/Chest: Breath sounds normal. No respiratory distress.  Abdominal: Soft. Bowel sounds are normal. There is no tenderness. There is no guarding.  Musculoskeletal: Normal range of motion. She exhibits tenderness.       Lumbar back: She exhibits spasm.       Back:  Lymphadenopathy:       Head (right side): No submandibular  adenopathy present.       Head (left side): No submandibular adenopathy present.    She has no cervical adenopathy.  Neurological: She is alert and oriented to person, place, and time. She has normal strength. No cranial nerve deficit or sensory deficit.  Skin: Skin is warm and dry.  Psychiatric: She has a normal mood and affect. Her speech is normal.  Nursing note and vitals reviewed.    ED Treatments / Results  Labs (all labs ordered are listed, but only abnormal results are displayed) Labs Reviewed - No data to display  EKG  EKG Interpretation None       Radiology No results found.  Procedures Procedures (including critical care time)  Medications Ordered in ED Medications - No data to display   Initial Impression / Assessment and Plan / ED Course  I have reviewed the triage vital signs and the nursing notes.  Pertinent labs & imaging results that were available during my care of the patient were reviewed by me and considered in my medical decision making (see chart for details).       Final Clinical Impressions(s) / ED Diagnoses MDM Blood pressure is elevated at 157/98.  Otherwise vital signs within normal limits.  No gross neurologic deficits appreciated on examination.  No evidence for cauda equina or other emergent changes.  The examination favors more of a muscle strain exacerbation.  I have asked the patient to continue her Robaxin and ibuprofen.  We will add Norco to the patient's pain regiment.  The patient is to follow-up with Dr. Romeo AppleHarrison for orthopedic evaluation and management if not improving. Patient is ambulatory without problem at discharge.   Final diagnoses:  Muscle spasm of back  Strain of lumbar region, initial encounter    ED Discharge Orders        Ordered    HYDROcodone-acetaminophen (NORCO/VICODIN) 5-325 MG tablet  Every 4 hours PRN     04/23/17 1312       Ivery QualeBryant, Euline Kimbler, PA-C 04/23/17 1849    Samuel JesterMcManus, Kathleen, DO 04/25/17  1736

## 2017-04-23 NOTE — Discharge Instructions (Signed)
Please use a heating pad to your lower back.  Please rest her back as much as possible.  Please continue your Robaxin and your ibuprofen as ordered.  Please add Norco for pain.  Norco and Robaxin may cause drowsiness, please use these medications with caution.  Please see Dr. Romeo AppleHarrison for orthopedic management of your pain if not improving.

## 2017-04-23 NOTE — ED Notes (Signed)
Pt ambulated to the restroom.

## 2017-04-23 NOTE — ED Triage Notes (Signed)
Return visit for back pain

## 2017-04-24 DIAGNOSIS — S335XXA Sprain of ligaments of lumbar spine, initial encounter: Secondary | ICD-10-CM | POA: Diagnosis not present

## 2017-04-28 ENCOUNTER — Ambulatory Visit (HOSPITAL_COMMUNITY): Payer: BLUE CROSS/BLUE SHIELD | Attending: Sports Medicine

## 2017-04-28 DIAGNOSIS — R531 Weakness: Secondary | ICD-10-CM | POA: Diagnosis not present

## 2017-04-28 DIAGNOSIS — M5441 Lumbago with sciatica, right side: Secondary | ICD-10-CM | POA: Diagnosis not present

## 2017-04-28 DIAGNOSIS — R2689 Other abnormalities of gait and mobility: Secondary | ICD-10-CM | POA: Diagnosis not present

## 2017-04-28 NOTE — Patient Instructions (Signed)
  LOWER TRUNK ROTATIONS - LTR 20 times, 3 second hold Lying on your back with your knees bent, gently move your knees side-to-side.    SINGLE KNEE TO CHEST STRETCH - SKTC 20 times, 3 second hold While Lying on your back, hold your knee and gently pull it up towards your chest.     PELVIC TILT - SUPINE 10 times, 3 second hold, 2 sets  Lie on your back with your knees bent. Next, arch your low back and then flatten it repeatedly. Your pelvis should tilt forward and back during the movement. Move through a comfortable range of motion.     BRACE- KNEE FALL OUT  20 times, 3 second hold  While lying on your back with both knees bent, stabilize your spine by bracing your abdominal muscles. Hold this contraction as you slowly lower one knee to the side. Your pelvis should not move. You can place your thumbs on your pelvic bone to get feedback of any movements that occur. If your pelvis moves too much, then next time lower the leg less to maintain good control.

## 2017-04-28 NOTE — Therapy (Signed)
[facility] [address] [place], [place], [address] Phone: [phone]   Fax:  [phone]  Physical Therapy Evaluation  Patient Details  Name: Alicia Wall MRN: [id number] Date of Birth: 07-20-1970 Referring Provider: Pati GalloJames Kramer, MD   Encounter Date: 04/28/2017  PT End of Session - 04/28/17 1016    Visit Number  1    Number of Visits  7    Date for PT Re-Evaluation  05/19/17    Authorization Type  [employer] other    Authorization Time Period  04/28/2017-05/23/2017    PT Start Time  0900    PT Stop Time  0945    PT Time Calculation (min)  45 min    Activity Tolerance  Patient tolerated treatment well;Patient limited by pain    Behavior During Therapy  [facility] for tasks assessed/performed       Past Medical History:  Diagnosis Date  . History of trichomoniasis   . Hypertension   . Trichomonas infection 03/14/2014    Past Surgical History:  Procedure Laterality Date  . IRRIGATION AND DEBRIDEMENT ABSCESS Left 04/16/2017   Procedure: MINOR REMOVAL OF FOREIGN BODY NIPPLE;  Surgeon: Lucretia RoersBridges, Lindsay C, MD;  Location: [facility];  Service: General;  Laterality: Left;  office LM for pt to arrive at 9:15    There were no vitals filed for this visit.   Subjective Assessment - 04/28/17 0909    Subjective  Pt reprots last tuesday she was shoveling snow and she instantly felt pain in her back. She has been to the ED twice since that day. She reports pain initially right side into ribs, but now has progressed to radiating pain down into right thigh anteriorly and down into calf. She notes numbness and inability to feel her cat rubbing up against her. She thinks it helps staying active but otherwise with rest she gets stiff. She reports her leg "giving out" twice. Denies changes to B&B. Pt notes laying supine aggravates symptoms as well as bending forward; gets relief in side-lying     Patient is accompained by:  Family member    Pertinent History   High BP that is controlled, otherwise unremarkeable     Limitations  Lifting;House hold activities;Standing;Walking    How long can you sit comfortably?  Approximately 10 minutes with radiating pain present that is tolerate     How long can you stand comfortably?  able to cook breakfast this am approximately 10 minutes    How long can you walk comfortably?  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  Yes    Pain Score  3     Pain Location  Back    Pain Orientation  Right;Lateral    Pain Descriptors / Indicators  Sharp;Numbness    Pain Type  Acute pain    Pain Radiating Towards  Hip to lateral right knee, numbness past knee     Pain Onset  In the past 7 days    Pain Frequency  Constant    Aggravating Factors   bending forward to pick something up and moving too quick     Pain Relieving Factors  heating pad on lateral right hip helps     Effect of Pain on Daily Activities  Unable to go to  work, has avoided cooking meals         [facility] PT Assessment - 04/28/17 0001      Assessment   Medical Diagnosis  Low back pain with sciatica    Referring Provider  Pati GalloJames Kramer, MD    Onset Date/Surgical Date  04/21/17    Prior Therapy  no      Precautions   Precautions  None      Restrictions   Weight Bearing Restrictions  No      Balance Screen   Has the patient fallen in the past 6 months  No    Has the patient had a decrease in activity level because of a fear of falling?   Yes    Is the patient reluctant to leave their home because of a fear of falling?   No      Home Environment   Living Environment  Private residence    Available Help at Discharge  Family    Home Access  Stairs to enter    Entrance Stairs-Number of Steps  4    Entrance Stairs-Rails  None      Prior Function   Level of Independence  Independent    Leisure  Exercise at  [employer] 2x/week and Yoga      Observation/Other Assessments   Focus on Therapeutic Outcomes (FOTO)   57% limitation      Posture/Postural Control   Posture/Postural Control  Postural limitations      AROM   Lumbar Flexion  WNL pain with extension, return standing    Lumbar Extension  severe limitation worse than flexion    Lumbar - Right Side Bend  WNL pain upon return    Lumbar - Left Side Bend  WNL    Lumbar - Right Rotation  WNL Slight discomfort    Lumbar - Left Rotation  WNL      Strength   Overall Strength Comments  Lt LE global 5/5    Right Hip Flexion  4/5    Right Hip Extension  4-/5 pain    Right Hip ABduction  4-/5 pain    Left Hip Extension  4+/5    Left Hip ABduction  4/5    Right/Left Knee  Right;Left    Right Knee Flexion  4/5    Right Knee Extension  4-/5 pain lateral right knee    Right Ankle Dorsiflexion  5/5    Right Ankle Plantar Flexion  5/5      Flexibility   Hamstrings  WNL    Quadriceps  Rt severe limitation    Piriformis  WNL      Palpation   Palpation comment  Right paraspinals, quadratus lumborum, lateral lower ribs, lateral posterior greater trochanter, lateral glutes, superior glutes, anteiror proximal quadricep      Special Tests    Special Tests  Lumbar    Lumbar Tests  Straight Leg Raise      Straight Leg Raise   Findings  Negative    Comment  bilaterally       Bed Mobility   Bed Mobility  Sit to Supine    Sit to Supine  Other (comment) cueing for improved form and safety of low back      Ambulation/Gait   Ambulation Distance (Feet)  50 Feet    Gait Pattern  Step-to pattern;Decreased step length - right;Decreased stance time - right;Decreased weight shift to right    Ambulation Surface  Level  Objective measurements completed on examination: See above findings.      [facility] Adult PT Treatment/Exercise - 04/28/17 0001      Lumbar Exercises: Stretches   Single Knee to Chest Stretch Limitations  10 reps    Lower  Trunk Rotation Limitations  10 reps    Pelvic Tilt Limitations  10 reps, 3sec hold      Lumbar Exercises: Supine   Other Supine Lumbar Exercises  Bent knee fall out with ab set x 10             PT Education - 04/28/17 1014    Education provided  Yes    Education Details  Patient educated regarding clinical impression and POC direction, on bed mobility to protect her lumbar spine, benefits of rotating her bed consistently, and given HEP for lumbar light ROM and core activation exercises.     Person(s) Educated  Patient    Methods  Handout;Explanation;Demonstration    Comprehension  Verbalized understanding       PT Short Term Goals - 04/28/17 1137      PT SHORT TERM GOAL #1   Title  Patient will be independent with HEP to improve pain management and decrease risk of re-injury.     Time  2    Period  Weeks    Status  New    Target Date  05/12/17      PT SHORT TERM GOAL #2   Title  Patient will report improved tolerance to sitting, standing and walking with at most 2/10 pain and decreased presence of radicular symptoms in preparation to return to work.     Time  2    Period  Weeks    Status  New    Target Date  05/12/17      PT SHORT TERM GOAL #3   Title  Pt will demonstrate indepedence with bed mobility with improved protection of lumbar spine without verbal cueing 3/5 trials.     Time  2    Period  Weeks    Status  New        PT Long Term Goals - 04/28/17 1140      PT LONG TERM GOAL #1   Title  Pt will demonstrate proper lifting techniques without verbal cueing to improve lumbar protection at work and with ADLs 3/5 trials.     Time  3    Period  Weeks    Status  New    Target Date  05/19/17      PT LONG TERM GOAL #2   Title  Pt will have improve strength by 1 MMT grade without increased pain to improve stability and support of lumbar spine to decrease pain.     Time  3    Period  Weeks    Status  New      PT LONG TERM GOAL #3   Title  Pt will have lumbar  A/ROM and quadricep flexibilty testing without pain and radiating symptoms into right LE indicating pain management to return to work.     Time  3    Period  Weeks    Status  New      PT LONG TERM GOAL #4   Title  Pt will have improve FOTO outcome measure score by at least 15% (score of 37%) to indicate improved functional mobility and pain control with ADLs.     Time  3    Period  Weeks    Status  New             Plan - 04/28/17 1018    Clinical Impression Statement  Pt is a very pleasant 46 year old woman presenting to O[facility] with lumbar pain after shoveling her drive way last week. She reports some pain with deep breathing initially but primary complaint is pain that is radiating into right lateral hip into anterior thigh, down into her right foot. She has some tenderness upon palpation globally right low thoracic and lumbar region, superior glutes and laterally posterior greater trochanter. She denies any falls at this point, however, notes she has had 2 times last week where her leg "gave out" on her. She presents with slight limitation in strength due to pain, good lumbar and hip range of motion with pain mostly extension based, and good flexibility with limitation in right quadriceps secondary to pain. Patient would benefit from skilled PT to address the above mentioned impairments to improve pain and mobility to encourage return to exercise regimen and work prior to injury. Pt would also benefit from education on lifting techniques and bed mobility to protect lumbar spine during functional activities.     History and Personal Factors relevant to plan of care:  high BP, controlled.     Clinical Presentation  Stable    Clinical Decision Making  Low    Rehab Potential  Good    Clinical Impairments Affecting Rehab Potential  (+): young, active, determined/motivated    PT Frequency  2x / week    PT Duration  3 weeks    PT Treatment/Interventions  ADLs/Self Care Home Management;Electrical  Stimulation;Gait training;Neuromuscular re-education;Stair training;Manual techniques;Energy conservation;Balance training;Therapeutic exercise;Patient/family education;Functional mobility training    PT Next Visit Plan  Review examination, goals and HEP; assess sensation right lower leg and foot. Focus on flexion based exercises, primarily on STM for pain management and spasms lumbar/glutes. Add QL stretch.     PT Home Exercise Plan  Eval: ab set, bent knee fall out, SKTC, LTR     Consulted and Agree with Plan of Care  Patient       Patient will benefit from skilled therapeutic intervention in order to improve the following deficits and impairments:  Abnormal gait, Decreased activity tolerance, Decreased mobility, Pain, Postural dysfunction, Increased muscle spasms, Decreased strength, Difficulty walking, Impaired flexibility  Visit Diagnosis: Acute right-sided low back pain with right-sided sciatica - Plan: PT plan of care cert/re-cert  Decreased strength - Plan: PT plan of care cert/re-cert  Functional gait abnormality - Plan: PT plan of care cert/re-cert     Problem List Patient Active Problem List   Diagnosis Date Noted  . Abscess of left nipple   . Infected pierced nipple   . Screening for diabetes mellitus 03/18/2017  . Encounter for vitamin deficiency screening 03/18/2017  . Trichomonas infection 03/14/2014  . Essential hypertension 02/22/2013   Candise Che PT, DPT 11:47 AM, 04/28/17 [phone]  [facility] [address] [place], [place], [address] Phone: [phone]   Fax:  [phone]  Name: Alicia Wall MRN: [id number] Date of Birth: 1970/06/02

## 2017-04-29 ENCOUNTER — Ambulatory Visit: Payer: BLUE CROSS/BLUE SHIELD | Admitting: Orthopaedic Surgery

## 2017-04-29 ENCOUNTER — Encounter: Payer: Self-pay | Admitting: Orthopaedic Surgery

## 2017-05-01 ENCOUNTER — Encounter (HOSPITAL_COMMUNITY): Payer: Self-pay

## 2017-05-01 ENCOUNTER — Ambulatory Visit: Payer: BLUE CROSS/BLUE SHIELD | Admitting: General Surgery

## 2017-05-01 ENCOUNTER — Ambulatory Visit (HOSPITAL_COMMUNITY): Payer: BLUE CROSS/BLUE SHIELD

## 2017-05-01 DIAGNOSIS — R531 Weakness: Secondary | ICD-10-CM | POA: Diagnosis not present

## 2017-05-01 DIAGNOSIS — M5441 Lumbago with sciatica, right side: Secondary | ICD-10-CM | POA: Diagnosis not present

## 2017-05-01 DIAGNOSIS — R2689 Other abnormalities of gait and mobility: Secondary | ICD-10-CM | POA: Diagnosis not present

## 2017-05-01 NOTE — Therapy (Signed)
[facility] [address] [place], [place], [address] Phone: [phone]   Fax:  [phone]  Physical Therapy Treatment  Patient Details  Name: Alicia Wall MRN: [id number] Date of Birth: [birth year] Referring Provider: Pati GalloJames Kramer, MD   Encounter Date: 05/01/2017  PT End of Session - 05/01/17 0824    Visit Number  2    Number of Visits  7    Date for PT Re-Evaluation  05/19/17    Authorization Type  BCBS other    Authorization Time Period  04/28/2017-05/23/2017    PT Start Time  0818    PT Stop Time  0859    PT Time Calculation (min)  41 min    Activity Tolerance  Patient tolerated treatment well;No increased pain    Behavior During Therapy  WFL for tasks assessed/performed       Past Medical History:  Diagnosis Date  . History of trichomoniasis   . Hypertension   . Trichomonas infection 03/14/2014    Past Surgical History:  Procedure Laterality Date  . IRRIGATION AND DEBRIDEMENT ABSCESS Left 04/16/2017   Procedure: MINOR REMOVAL OF FOREIGN BODY NIPPLE;  Surgeon: Lucretia RoersBridges, Lindsay C, MD;  Location: [facility];  Service: General;  Laterality: Left;  office LM for pt to arrive at 9:15    There were no vitals filed for this visit.  Subjective Assessment - 05/01/17 0821    Subjective  Pt reports her back is feeling a whole lot better, does continue to have tingling on lateral right hip stopping at knee and numbness down to ankle.  Current pain scale 2/10 Rt LE.  Reports compliance with HEP daily and has completed a lot of walking.    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  Yes    Pain Score  2     Pain Location  Back    Pain Orientation  Right    Pain Descriptors / Indicators  Sharp;Numbness    Pain Type  Acute pain    Pain Radiating Towards  hip to lateral right  knee, numbness past knee    Pain Onset  In the past 7 days    Pain Frequency  Intermittent    Aggravating Factors   bending forward to pick something up and moving too quick    Pain Relieving Factors  heating pad on lateral right hip helps    Effect of Pain on Daily Activities  Unable to go to work, has avoided cooking meals.                        [facility] Adult PT Treatment/Exercise - 05/01/17 0001      Bed Mobility   Bed Mobility  Sit to Sidelying Left    Sit to Supine  --    Sit to Sidelying Left  5: Supervision    Sit to Sidelying Left Details (indicate cue type and reason)  Cueing for lumbar roll mechanics to improve bed mobilty      Lumbar Exercises: Stretches   Single Knee to Chest Stretch  3 reps;30 seconds    Lower Trunk Rotation Limitations  10 reps 10x 10"; initial pain with Lt side, reports reduction wholds    Quadruped Mid Back Stretch  2 reps;30 seconds  seated and child's pose      Lumbar Exercises: Supine   Other Supine Lumbar Exercises  Bent knee fall out with ab set x 10      Manual Therapy   Manual Therapy  Soft tissue mobilization    Manual therapy comments  Manual complete separate than rest of tx    Soft tissue mobilization  QL and Rt gluteal musculature in Lt sidelying then prone with pillow between knees and under hips              PT Education - 05/01/17 0836    Education provided  Yes    Education Details  Reviewed goals, assured compliance and reviewed form/technique with HEP and copy of eval given to pt.  Lumbar roll in seated position    Person(s) Educated  Patient    Methods  Explanation;Demonstration;Handout;Verbal cues min cueing for stability with therex    Comprehension  Verbalized understanding;Returned demonstration       PT Short Term Goals - 04/28/17 1137      PT SHORT TERM GOAL #1   Title  Patient will be independent with HEP to improve pain management and decrease risk of re-injury.     Time  2    Period  Weeks     Status  New    Target Date  05/12/17      PT SHORT TERM GOAL #2   Title  Patient will report improved tolerance to sitting, standing and walking with at most 2/10 pain and decreased presence of radicular symptoms in preparation to return to work.     Time  2    Period  Weeks    Status  New    Target Date  05/12/17      PT SHORT TERM GOAL #3   Title  Pt will demonstrate indepedence with bed mobility with improved protection of lumbar spine without verbal cueing 3/5 trials.     Time  2    Period  Weeks    Status  New        PT Long Term Goals - 04/28/17 1140      PT LONG TERM GOAL #1   Title  Pt will demonstrate proper lifting techniques without verbal cueing to improve lumbar protection at work and with ADLs 3/5 trials.     Time  3    Period  Weeks    Status  New    Target Date  05/19/17      PT LONG TERM GOAL #2   Title  Pt will have improve strength by 1 MMT grade without increased pain to improve stability and support of lumbar spine to decrease pain.     Time  3    Period  Weeks    Status  New      PT LONG TERM GOAL #3   Title  Pt will have lumbar A/ROM and quadricep flexibilty testing without pain and radiating symptoms into right LE indicating pain management to return to work.     Time  3    Period  Weeks    Status  New      PT LONG TERM GOAL #4   Title  Pt will have improve FOTO outcome measure score by at least 15% (score of 37%) to indicate improved functional mobility and pain control with ADLs.     Time  3    Period  Weeks    Status  New  Plan - 05/01/17 0839    Clinical Impression Statement  Reviewed goals, assured compliance and reviewed form/technique with HEP and copy of eval given to pt.  Pt reports vast improvements with LBP with HEP but does continue to have radicular pain lateral Rt hip down to knee with some numbness of ending at ankle.  Pt able to complete all exercises with good form following cueing for core stability and initial  form with new stretches.  EOS with manual STM to address moderate to severe spasm on Rt QL and gluteal region.  Pt educated on seated lumbar support to improve posture and comfort.  No reorts of increased pain at EOS.    Rehab Potential  Good    Clinical Impairments Affecting Rehab Potential  (+): young, active, determined/motivated    PT Frequency  2x / week    PT Duration  3 weeks    PT Treatment/Interventions  ADLs/Self Care Home Management;Electrical Stimulation;Gait training;Neuromuscular re-education;Stair training;Manual techniques;Energy conservation;Balance training;Therapeutic exercise;Patient/family education;Functional mobility training    PT Next Visit Plan  Assess sensation right lower leg and foot. Focus on flexion based exercises, primarily on STM for pain management and spasms lumbar/glutes. Add QL stretch.  Begin reaching to Lt and Rt in child's pose next session.      PT Home Exercise Plan  Eval: ab set, bent knee fall out, SKTC, LTR        Patient will benefit from skilled therapeutic intervention in order to improve the following deficits and impairments:  Abnormal gait, Decreased activity tolerance, Decreased mobility, Pain, Postural dysfunction, Increased muscle spasms, Decreased strength, Difficulty walking, Impaired flexibility  Visit Diagnosis: Acute right-sided low back pain with right-sided sciatica  Decreased strength  Functional gait abnormality     Problem List Patient Active Problem List   Diagnosis Date Noted  . Abscess of left nipple   . Infected pierced nipple   . Screening for diabetes mellitus 03/18/2017  . Encounter for vitamin deficiency screening 03/18/2017  . Trichomonas infection 03/14/2014  . Essential hypertension 02/22/2013   Becky Saxasey Raygen Dahm, LPTA; CBIS [phone]  Juel BurrowCockerham, Dosha Broshears Jo 05/01/2017, 4:34 PM  [facility] [address] [place], [place], [address] Phone: [phone]   Fax:   [phone]  Name: Alicia PiqueKassandria Wall MRN: [id number] Date of Birth: 03-20-1971

## 2017-05-07 ENCOUNTER — Telehealth (HOSPITAL_COMMUNITY): Payer: Self-pay

## 2017-05-07 ENCOUNTER — Ambulatory Visit (HOSPITAL_COMMUNITY): Payer: BLUE CROSS/BLUE SHIELD

## 2017-05-07 NOTE — Telephone Encounter (Signed)
No show #1; called re missed appointment and pt stated she thought her appointment was tomorrow at 1pm. Reminded pt of next appointment on Friday at 1:45 and she stated she'd be here.   Jac CanavanBrooke Lura Falor PT, DPT

## 2017-05-08 ENCOUNTER — Ambulatory Visit: Payer: BLUE CROSS/BLUE SHIELD | Admitting: General Surgery

## 2017-05-08 ENCOUNTER — Ambulatory Visit (INDEPENDENT_AMBULATORY_CARE_PROVIDER_SITE_OTHER): Payer: Self-pay | Admitting: General Surgery

## 2017-05-08 VITALS — BP 159/101 | HR 96 | Temp 98.4°F | Resp 18 | Ht 62.0 in | Wt 148.0 lb

## 2017-05-08 DIAGNOSIS — N611 Abscess of the breast and nipple: Secondary | ICD-10-CM

## 2017-05-08 DIAGNOSIS — N61 Mastitis without abscess: Secondary | ICD-10-CM

## 2017-05-08 DIAGNOSIS — S21039A Puncture wound without foreign body of unspecified breast, initial encounter: Principal | ICD-10-CM

## 2017-05-09 ENCOUNTER — Ambulatory Visit (HOSPITAL_COMMUNITY): Payer: BLUE CROSS/BLUE SHIELD

## 2017-05-09 ENCOUNTER — Encounter (HOSPITAL_COMMUNITY): Payer: Self-pay

## 2017-05-09 DIAGNOSIS — R2689 Other abnormalities of gait and mobility: Secondary | ICD-10-CM

## 2017-05-09 DIAGNOSIS — M5441 Lumbago with sciatica, right side: Secondary | ICD-10-CM

## 2017-05-09 DIAGNOSIS — R531 Weakness: Secondary | ICD-10-CM

## 2017-05-09 NOTE — Therapy (Signed)
[facility] [address] [place], [place], [address] Phone: [phone]   Fax:  [phone]  Physical Therapy Treatment  Patient Details  Name: Alicia PiqueKassandria Wall MRN: [id number] Date of Birth: 11-01-1970 Referring Provider: Pati GalloJames Kramer, MD   Encounter Date: 05/09/2017  PT End of Session - 05/09/17 1348    Visit Number  3    Number of Visits  7    Date for PT Re-Evaluation  05/19/17    Authorization Type  BCBS other    Authorization Time Period  04/28/2017-05/23/2017    PT Start Time  1345    PT Stop Time  1427    PT Time Calculation (min)  42 min    Activity Tolerance  Patient tolerated treatment well;No increased pain    Behavior During Therapy  WFL for tasks assessed/performed       Past Medical History:  Diagnosis Date  . History of trichomoniasis   . Hypertension   . Trichomonas infection 03/14/2014    Past Surgical History:  Procedure Laterality Date  . IRRIGATION AND DEBRIDEMENT ABSCESS Left 04/16/2017   Procedure: MINOR REMOVAL OF FOREIGN BODY NIPPLE;  Surgeon: Lucretia RoersBridges, Lindsay C, MD;  Location: [facility];  Service: General;  Laterality: Left;  office LM for pt to arrive at 9:15    There were no vitals filed for this visit.  Subjective Assessment - 05/09/17 1348    Subjective  Pt states that she had been feeling better for the last few days but the past couple of days, her pain has increased in her RLE.     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  Yes    Pain Score  7     Pain Location  Leg    Pain Orientation  Right    Pain Descriptors / Indicators  Sharp    Pain Type  Acute pain    Pain Onset  1 to 4 weeks ago    Pain Frequency  Intermittent    Aggravating Factors   bedning forward to pick something up and moving too quick    Pain Relieving Factors   heating pad on lateral R hip helps    Effect of Pain on Daily Activities  unable to go to work, has avoided cooking meals             [facility] Adult PT Treatment/Exercise - 05/09/17 0001      Lumbar Exercises: Stretches   Single Knee to Chest Stretch  3 reps;30 seconds    Single Knee to Chest Stretch Limitations  BLE    Double Knee to Chest Stretch  3 reps;30 seconds    Pelvic Tilt Limitations  child's pose 2x30 forward; 2x30" with hands to the L    Prone on Elbows Stretch Limitations  2 x2 mins bouts    Quad Stretch  3 reps;30 seconds    Quad Stretch Limitations  RLE only      Manual Therapy   Manual Therapy  Soft tissue mobilization;Myofascial release    Manual therapy comments  Manual complete separate than rest of tx    Soft tissue mobilization  lumbar paraspinals, QL, distal/lateral obliques, proximal glutes    Myofascial Release  glute med ( reported referred pain into R anterior thigh)  PT Education - 05/09/17 1427    Education provided  Yes    Education Details  may have increased tenderness to areas where manual was performed but will improve; tennis ball for self soft tissue work    [employer]) Educated  Patient    Methods  Explanation;Demonstration    Comprehension  Verbalized understanding;Returned demonstration       PT Short Term Goals - 04/28/17 1137      PT SHORT TERM GOAL #1   Title  Patient will be independent with HEP to improve pain management and decrease risk of re-injury.     Time  2    Period  Weeks    Status  New    Target Date  05/12/17      PT SHORT TERM GOAL #2   Title  Patient will report improved tolerance to sitting, standing and walking with at most 2/10 pain and decreased presence of radicular symptoms in preparation to return to work.     Time  2    Period  Weeks    Status  New    Target Date  05/12/17      PT SHORT TERM GOAL #3   Title  Pt will demonstrate indepedence with bed mobility with improved protection of  lumbar spine without verbal cueing 3/5 trials.     Time  2    Period  Weeks    Status  New        PT Long Term Goals - 04/28/17 1140      PT LONG TERM GOAL #1   Title  Pt will demonstrate proper lifting techniques without verbal cueing to improve lumbar protection at work and with ADLs 3/5 trials.     Time  3    Period  Weeks    Status  New    Target Date  05/19/17      PT LONG TERM GOAL #2   Title  Pt will have improve strength by 1 MMT grade without increased pain to improve stability and support of lumbar spine to decrease pain.     Time  3    Period  Weeks    Status  New      PT LONG TERM GOAL #3   Title  Pt will have lumbar A/ROM and quadricep flexibilty testing without pain and radiating symptoms into right LE indicating pain management to return to work.     Time  3    Period  Weeks    Status  New      PT LONG TERM GOAL #4   Title  Pt will have improve FOTO outcome measure score by at least 15% (score of 37%) to indicate improved functional mobility and pain control with ADLs.     Time  3    Period  Weeks    Status  New            Plan - 05/09/17 1428    Clinical Impression Statement  Pt with increased RLE radicular pain this date. Introduced pt to prone quad stretch to assess response with no real change, but pt did state that lying POE felt good. PT had pt lay POE for ~4 mins and she reported her pain decreased to 6/10 (was 7/10 at beginning). Heavy focus on manual therapy this date for soft tissue restrictions of distal/lateral obliques, distal paraspinals, and proximal glutes. Pt reported referred pain into R anterior thigh with palpation to glutes. Added child's pose to continue to  address soft tissue restrictions of these muscles. She reported much improved symptoms to a 4/10 pain at EOS. Continue POC as planned.    Rehab Potential  Good    Clinical Impairments Affecting Rehab Potential  (+): young, active, determined/motivated    PT Frequency  2x / week     PT Duration  3 weeks    PT Treatment/Interventions  ADLs/Self Care Home Management;Electrical Stimulation;Gait training;Neuromuscular re-education;Stair training;Manual techniques;Energy conservation;Balance training;Therapeutic exercise;Patient/family education;Functional mobility training    PT Next Visit Plan  Focus on flexion based exercises, primarily on STM for pain management and spasms lumbar/glutes. Add QL stretch.  Begin reaching to Lt and Rt in child's pose next session.  focus on glute med MFR/STM    PT Home Exercise Plan  Eval: ab set, bent knee fall out, SKTC, LTR     Consulted and Agree with Plan of Care  Patient       Patient will benefit from skilled therapeutic intervention in order to improve the following deficits and impairments:  Abnormal gait, Decreased activity tolerance, Decreased mobility, Pain, Postural dysfunction, Increased muscle spasms, Decreased strength, Difficulty walking, Impaired flexibility  Visit Diagnosis: No diagnosis found.     Problem List Patient Active Problem List   Diagnosis Date Noted  . Abscess of left nipple   . Infected pierced nipple   . Screening for diabetes mellitus 03/18/2017  . Encounter for vitamin deficiency screening 03/18/2017  . Trichomonas infection 03/14/2014  . Essential hypertension 02/22/2013      Jac CanavanBrooke Powell PT, DPT   [facility] [address] [place], [place], [address] Phone: [phone]   Fax:  [phone]  Name: Alicia Wall MRN: [id number] Date of Birth: May 01, 1971

## 2017-05-10 ENCOUNTER — Encounter: Payer: Self-pay | Admitting: General Surgery

## 2017-05-10 NOTE — Progress Notes (Signed)
[facility] Note   HPI:  46 y.o. Female presents to clinic for post-op follow-up evaluation after removal of her left nipple ring that was causing an abscess and was being epithelized by her skin on one end. She is doing great from the nipple but is having sciatica on the right since the [place]. This is the reason for the 10/10 pain report.   Review of Systems:  No issues with nipple Healed, no drainage Right leg sciatica s/p injections, seeing ortho All other review of systems: otherwise negative   Vital Signs:  BP (!) 159/101   Pulse 96   Temp 98.4 F (36.9 C)   Resp 18   Ht 5\' 2"  (1.575 m)   Wt 148 lb (67.1 kg)   LMP 04/08/2017   BMI 27.07 kg/m    Physical Exam:  Physical Exam  Constitutional: She is well-developed, well-nourished, and in no distress.  Cardiovascular: Normal rate.  Pulmonary/Chest: Effort normal.  Left nipple healed, no drainage or scar   Vitals reviewed.   Laboratory studies: None  Imaging:  None   Assessment:  46 y.o. yo Female s/p removal of left nipple ring after abscess and epithelization of the ring. She is doing great from this and wants to get another ring in the future.  She is due for her mammogram and plans to get it in the upcoming weeks.   Plan:  - Get scheduled mammogram - Follow up with me PRN  - Sciatica treated by other physicians    Algis GreenhouseLindsay Dequane Strahan, MD [facility] [address] [place], [place] [phone] (office)

## 2017-05-12 ENCOUNTER — Encounter (HOSPITAL_COMMUNITY): Payer: Self-pay

## 2017-05-12 ENCOUNTER — Ambulatory Visit (HOSPITAL_COMMUNITY): Payer: BLUE CROSS/BLUE SHIELD

## 2017-05-12 DIAGNOSIS — M5441 Lumbago with sciatica, right side: Secondary | ICD-10-CM

## 2017-05-12 DIAGNOSIS — R2689 Other abnormalities of gait and mobility: Secondary | ICD-10-CM

## 2017-05-12 DIAGNOSIS — R531 Weakness: Secondary | ICD-10-CM | POA: Diagnosis not present

## 2017-05-12 NOTE — Therapy (Signed)
[place], [place], [address] Phone: [phone]   Fax:  [phone]  Physical Therapy Treatment/Reassessment  Patient Details  Name: Alicia Wall MRN: [id number] Date of Birth: [birth year] Referring Provider: Berle Mull, MD   Encounter Date: 05/12/2017  PT End of Session - 05/12/17 0945    Visit Number  4    Number of Visits  7    Date for PT Re-Evaluation  05/19/17    Authorization Type  BCBS other    Authorization Time Period  04/28/2017-05/23/2017    PT Start Time  0945    PT Stop Time  1027    PT Time Calculation (min)  42 min    Activity Tolerance  Patient tolerated treatment well;No increased pain    Behavior During Therapy  WFL for tasks assessed/performed       Past Medical History:  Diagnosis Date  . History of trichomoniasis   . Hypertension   . Trichomonas infection 03/14/2014    Past Surgical History:  Procedure Laterality Date  . IRRIGATION AND DEBRIDEMENT ABSCESS Left 04/16/2017   Procedure: MINOR REMOVAL OF FOREIGN BODY NIPPLE;  Surgeon: Virl Cagey, MD;  Location: [facility];  Service: General;  Laterality: Left;  office LM for pt to arrive at 9:15    There were no vitals filed for this visit.  Subjective Assessment - 05/12/17 0946    Subjective  Pt states that she is feeling better than she did at the beginning of last session. She states that she was a little sore following manual therapy last session. She is supposed to go to work Wednesday.     Pertinent History  High BP that is controlled, otherwise unremarkeable     Patient Stated Goals  get back to work [employer], standing 12 hrs); get back to the [employer] 2x per week     Currently in Pain?  Yes    Pain Score  4     Pain Location  Leg    Pain Orientation  Right    Pain Descriptors / Indicators  Sharp    Pain Type  Acute pain    Pain Onset  1 to 4 weeks ago    Pain Frequency  Intermittent    Aggravating Factors    bending forward to pick something up and moving too quick    Pain Relieving Factors  heating pad on lateral R hip helps    Effect of Pain on Daily Activities  unable to go to work, has avoided cooking meals         [facility] PT Assessment - 05/12/17 0001      AROM   Lumbar Flexion  WNL no pain    Lumbar Extension  min to mod limitation 7/10 pain with ext    Lumbar - Right Side Bend  WNL no pain    Lumbar - Left Side Bend  WNL pulling on R    Lumbar - Right Rotation  WNL no pain    Lumbar - Left Rotation  WNL no pain      Strength   Right Hip Flexion  4+/5 was 4    Right Hip Extension  4-/5 LBP; was 4-    Right Hip ABduction  4+/5 painful, was 4-    Left Hip Extension  4+/5 was 4+    Left Hip ABduction  4+/5 was 4    Right Knee Flexion  5/5 painful; was 4    Right  Knee Extension  4+/5 painful anterior knee; was 4-           [facility] Adult PT Treatment/Exercise - 05/12/17 0001      Lumbar Exercises: Supine   Clam  10 reps    Clam Limitations  BLE, RTB with ab set    Bridge  10 reps      Manual Therapy   Manual Therapy  Soft tissue mobilization;Myofascial release    Manual therapy comments  Manual complete separate than rest of tx    Soft tissue mobilization  lumbar paraspinals, QL, distal/lateral obliques, proximal glutes    Myofascial Release  glute med ( reported referred pain into R anterior thigh)            PT Education - 05/12/17 0946    Education provided  Yes    Person(s) Educated  Patient    Methods  Explanation    Comprehension  Verbalized understanding       PT Short Term Goals - 05/12/17 0948      PT SHORT TERM GOAL #1   Title  Patient will be independent with HEP to improve pain management and decrease risk of re-injury.     Time  2    Period  Weeks    Status  Achieved      PT SHORT TERM GOAL #2   Title  Patient will report improved tolerance to sitting, standing and walking with at most 2/10 pain and decreased presence of radicular symptoms in  preparation to return to work.     Baseline  12/31: still gets up to an 8/10 with sitting, standing, and walking, but is able to do each for a little longer than at initial eval    Time  2    Period  Weeks    Status  On-going      PT SHORT TERM GOAL #3   Title  Pt will demonstrate indepedence with bed mobility with improved protection of lumbar spine without verbal cueing 3/5 trials.     Baseline  12/31: sit > supine was great, no cues needed, no pain; supine > sit required cerval cues for log rolling    Time  2    Period  Weeks    Status  Partially Met        PT Long Term Goals - 05/12/17 0950      PT LONG TERM GOAL #1   Title  Pt will demonstrate proper lifting techniques without verbal cueing to improve lumbar protection at work and with ADLs 3/5 trials.     Time  3    Period  Weeks    Status  On-going      PT LONG TERM GOAL #2   Title  Pt will have improve strength by 1 MMT grade without increased pain to improve stability and support of lumbar spine to decrease pain.     Time  3    Period  Weeks    Status  Partially Met      PT LONG TERM GOAL #3   Title  Pt will have lumbar A/ROM and quadricep flexibilty testing without pain and radiating symptoms into right LE indicating pain management to return to work.     Baseline  12/31: pain with lumbar ROM has significantly improved but still, R quad still tight and pain    Time  3    Period  Weeks    Status  On-going      PT LONG  TERM GOAL #4   Title  Pt will have improve FOTO outcome measure score by at least 15% (score of 37%) to indicate improved functional mobility and pain control with ADLs.     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 Pt has made great improvements since starting therapy as illustrated above. She reports feeling about 75% improved,  reporting that the sharp shooting pain in her RLE is her main limiting factor. Pt's strength has improved though still painful throughout RLE and she had less pain with lumbar ROM this date. POC will be continued as planned for remainder of POC. Rest of session focused on gluteal strengthening and manual to address continued soft tissue restrictions. She reported 3/10 pain at EOS.    Rehab Potential  Good    Clinical Impairments Affecting Rehab Potential  (+): young, active, determined/motivated    PT Frequency  2x / week    PT Duration  3 weeks    PT Treatment/Interventions  ADLs/Self Care Home Management;Electrical Stimulation;Gait training;Neuromuscular re-education;Stair training;Manual techniques;Energy conservation;Balance training;Therapeutic exercise;Patient/family education;Functional mobility training    PT Next Visit Plan  Focus on flexion based exercises, primarily on STM for pain management and spasms lumbar/glutes. continue stretching; continue glute strengthening;  focus on glute med MFR/STM; assess SI    PT Home Exercise Plan  Eval: ab set, bent knee fall out, SKTC, LTR     Consulted and Agree with Plan of Care  Patient       Patient will benefit from skilled therapeutic intervention in order to improve the following deficits and impairments:  Abnormal gait, Decreased activity tolerance, Decreased mobility, Pain, Postural dysfunction, Increased muscle spasms, Decreased strength, Difficulty walking, Impaired flexibility  Visit Diagnosis: Acute right-sided low back pain with right-sided sciatica  Decreased strength  Functional gait abnormality     Problem List Patient Active Problem List   Diagnosis Date Noted  . Abscess of left nipple   . Infected pierced nipple   . Screening for diabetes mellitus 03/18/2017  . Encounter for vitamin deficiency screening 03/18/2017  . Trichomonas infection 03/14/2014  . Essential hypertension 02/22/2013      Geraldine Solar PT,  DPT  [place] [address] [place], [place], [address] Phone: [phone]   Fax:  [phone]  Name: Mariza Bourget MRN: [id number] Date of Birth: December 14, 1970

## 2017-05-14 ENCOUNTER — Telehealth (HOSPITAL_COMMUNITY): Payer: Self-pay

## 2017-05-14 ENCOUNTER — Ambulatory Visit (HOSPITAL_COMMUNITY): Payer: BLUE CROSS/BLUE SHIELD

## 2017-05-14 DIAGNOSIS — S335XXA Sprain of ligaments of lumbar spine, initial encounter: Secondary | ICD-10-CM | POA: Diagnosis not present

## 2017-05-14 NOTE — Telephone Encounter (Signed)
I called the patient's home phone number on file and spoke directly to her. I informed her she missed her appointment today at 3:15 PM and that we did not have any more scheduled at this time. She states she thought her appointment was for Friday this week. I then transferred her to the front office to set up another appointment which she was agreeable to.  Valentino Saxonachel Quinn-Brown, PT, DPT Physical Therapist with  [facility]  05/14/2017 3:34 PM

## 2017-05-16 DIAGNOSIS — M545 Low back pain: Secondary | ICD-10-CM | POA: Diagnosis not present

## 2017-05-19 DIAGNOSIS — M545 Low back pain: Secondary | ICD-10-CM | POA: Diagnosis not present

## 2017-05-27 ENCOUNTER — Ambulatory Visit (HOSPITAL_COMMUNITY): Payer: BLUE CROSS/BLUE SHIELD | Attending: Sports Medicine

## 2017-05-27 ENCOUNTER — Telehealth (HOSPITAL_COMMUNITY): Payer: Self-pay

## 2017-05-27 NOTE — Telephone Encounter (Signed)
No-Show #1: I called the patient'ss home phone number on file to check in as she missed her appointment for this afternoon at 3:15 PM. She did not answer so I left a message on her home phone to inform her she does not have any future appointments scheduled at this time and asked her to call the front office to re-schedule. I left the office number with her: [phone].  Valentino Saxonachel Quinn-Brown, PT, DPT Physical Therapist with [facility]  05/27/2017 3:40 PM

## 2017-08-18 ENCOUNTER — Encounter (HOSPITAL_COMMUNITY): Payer: Self-pay

## 2017-08-18 NOTE — Therapy (Addendum)
[place], [place], [address] Phone: [phone]   Fax:  [phone]  Patient Details  Name: Alicia Wall MRN: [id number] Date of Birth: 1971/04/12 Referring Provider:  No ref. provider found  Encounter Date: 08/18/2017   PHYSICAL THERAPY DISCHARGE SUMMARY  Visits from Start of Care: 4  Current functional level related to goals / functional outcomes: See last treatment note   Remaining deficits: See last treatment note   Education / Equipment: n/a Plan: Patient agrees to discharge.  Patient goals were partially met. Patient is being discharged due to not returning since the last visit.  ?????       Geraldine Solar PT, [place] [address] [place], [place], [address] Phone: [phone]   Fax:  [phone]

## 2017-08-27 DIAGNOSIS — J111 Influenza due to unidentified influenza virus with other respiratory manifestations: Secondary | ICD-10-CM | POA: Diagnosis not present

## 2017-08-27 DIAGNOSIS — J209 Acute bronchitis, unspecified: Secondary | ICD-10-CM | POA: Diagnosis not present

## 2018-02-09 ENCOUNTER — Ambulatory Visit (HOSPITAL_COMMUNITY)
Admission: RE | Admit: 2018-02-09 | Discharge: 2018-02-09 | Disposition: A | Discharge status: Home / Self Care | Payer: BLUE CROSS/BLUE SHIELD | Source: Ambulatory Visit | Attending: Adult Health | Admitting: Adult Health

## 2018-02-09 DIAGNOSIS — Z1231 Encounter for screening mammogram for malignant neoplasm of breast: Secondary | ICD-10-CM | POA: Diagnosis not present

## 2018-03-19 ENCOUNTER — Ambulatory Visit: Payer: BLUE CROSS/BLUE SHIELD | Admitting: Adult Health

## 2018-03-19 ENCOUNTER — Encounter: Payer: Self-pay | Admitting: Adult Health

## 2018-03-19 ENCOUNTER — Encounter (INDEPENDENT_AMBULATORY_CARE_PROVIDER_SITE_OTHER): Payer: Self-pay

## 2018-03-19 VITALS — BP 134/85 | HR 89 | Ht 62.5 in | Wt 149.5 lb

## 2018-03-19 DIAGNOSIS — Z01419 Encounter for gynecological examination (general) (routine) without abnormal findings: Secondary | ICD-10-CM | POA: Diagnosis not present

## 2018-03-19 DIAGNOSIS — N951 Menopausal and female climacteric states: Secondary | ICD-10-CM | POA: Insufficient documentation

## 2018-03-19 DIAGNOSIS — N926 Irregular menstruation, unspecified: Secondary | ICD-10-CM

## 2018-03-19 DIAGNOSIS — Z3202 Encounter for pregnancy test, result negative: Secondary | ICD-10-CM | POA: Diagnosis not present

## 2018-03-19 DIAGNOSIS — Z1212 Encounter for screening for malignant neoplasm of rectum: Secondary | ICD-10-CM

## 2018-03-19 DIAGNOSIS — I10 Essential (primary) hypertension: Secondary | ICD-10-CM

## 2018-03-19 DIAGNOSIS — Z1211 Encounter for screening for malignant neoplasm of colon: Secondary | ICD-10-CM

## 2018-03-19 LAB — POCT URINE PREGNANCY: PREG TEST UR: NEGATIVE

## 2018-03-19 MED ORDER — HYDROCHLOROTHIAZIDE 12.5 MG PO CAPS: 12.5000 mg | ORAL_CAPSULE | Freq: Every day | ORAL | 4 refills | Status: DC

## 2018-03-19 NOTE — Patient Instructions (Signed)
Perimenopause Perimenopause is the time when your body begins to move into the menopause (no menstrual period for 12 straight months). It is a natural process. Perimenopause can begin 2-8 years before the menopause and usually lasts for 1 year after the menopause. During this time, your ovaries may or may not produce an egg. The ovaries vary in their production of estrogen and progesterone hormones each month. This can cause irregular menstrual periods, difficulty getting pregnant, vaginal bleeding between periods, and uncomfortable symptoms. What are the causes?  Irregular production of the ovarian hormones, estrogen and progesterone, and not ovulating every month. Other causes include:  Tumor of the pituitary gland in the brain.  Medical disease that affects the ovaries.  Radiation treatment.  Chemotherapy.  Unknown causes.  Heavy smoking and excessive alcohol intake can bring on perimenopause sooner. What are the signs or symptoms?  Hot flashes.  Night sweats.  Irregular menstrual periods.  Decreased sex drive.  Vaginal dryness.  Headaches.  Mood swings.  Depression.  Memory problems.  Irritability.  Tiredness.  Weight gain.  Trouble getting pregnant.  The beginning of losing bone cells (osteoporosis).  The beginning of hardening of the arteries (atherosclerosis). How is this diagnosed? Your health care provider will make a diagnosis by analyzing your age, menstrual history, and symptoms. He or she will do a physical exam and note any changes in your body, especially your female organs. Female hormone tests may or may not be helpful depending on the amount of female hormones you produce and when you produce them. However, other hormone tests may be helpful to rule out other problems. How is this treated? In some cases, no treatment is needed. The decision on whether treatment is necessary during the perimenopause should be made by you and your health care  provider based on how the symptoms are affecting you and your lifestyle. Various treatments are available, such as:  Treating individual symptoms with a specific medicine for that symptom.  Herbal medicines that can help specific symptoms.  Counseling.  Group therapy. Follow these instructions at home:  Keep track of your menstrual periods (when they occur, how heavy they are, how long between periods, and how long they last) as well as your symptoms and when they started.  Only take over-the-counter or prescription medicines as directed by your health care provider.  Sleep and rest.  Exercise.  Eat a diet that contains calcium (good for your bones) and soy (acts like the estrogen hormone).  Do not smoke.  Avoid alcoholic beverages.  Take vitamin supplements as recommended by your health care provider. Taking vitamin E may help in certain cases.  Take calcium and vitamin D supplements to help prevent bone loss.  Group therapy is sometimes helpful.  Acupuncture may help in some cases. Contact a health care provider if:  You have questions about any symptoms you are having.  You need a referral to a specialist (gynecologist, psychiatrist, or psychologist). Get help right away if:  You have vaginal bleeding.  Your period lasts longer than 8 days.  Your periods are recurring sooner than 21 days.  You have bleeding after intercourse.  You have severe depression.  You have pain when you urinate.  You have severe headaches.  You have vision problems. This information is not intended to replace advice given to you by your health care provider. Make sure you discuss any questions you have with your health care provider. Document Released: 06/06/2004 Document Revised: 10/05/2015 Document Reviewed: 11/26/2012 Elsevier Interactive   Patient Education  2017 Elsevier Inc.  

## 2018-03-19 NOTE — Progress Notes (Signed)
Patient ID: Alicia Wall, female   DOB: 10-16-1970, 47 y.o.   MRN: [id number] History of Present Illness: Alicia Wall is a 47 year old black female in for a well woman gyn exam,she had a normal pap with negative HPV 03/07/16.    Current Medications, Allergies, Past Medical History, Past Surgical History, Family History and Social History were reviewed in [employer] record.     Review of Systems:  Patient denies any headaches, hearing loss, fatigue, blurred vision, shortness of breath, chest pain, abdominal pain, problems with bowel movements, urination, or intercourse. No joint pain or mood swings. Periods are irregular, has skipped last 2 months    Physical Exam:BP 134/85 (BP Location: Left Arm, Patient Position: Sitting, Cuff Size: Normal)   Pulse 89   Ht 5' 2.5" (1.588 m)   Wt 149 lb 8 oz (67.8 kg)   BMI 26.91 kg/m UPT negative.  General:  Well developed, well nourished, no acute distress Skin:  Warm and dry Neck:  Midline trachea, normal thyroid, good ROM, no lymphadenopathy Lungs; Clear to auscultation bilaterally Breast:  No dominant palpable mass, retraction, or nipple discharge Cardiovascular: Regular rate and rhythm Abdomen:  Soft, non tender, no hepatosplenomegaly Pelvic:  External genitalia is normal in appearance, no lesions.  The vagina is normal in appearance,pink discharge. Urethra has no lesions or masses. The cervix is smooth.  Uterus is felt to be normal size, shape, and contour.  No adnexal masses or tenderness noted.Bladder is non tender, no masses felt. Rectal: Good sphincter tone, no polyps, or hemorrhoids felt.  Hemoccult negative. Extremities/musculoskeletal:  No swelling or varicosities noted, no clubbing or cyanosis Psych:  No mood changes, alert and cooperative,seems happy PHQ 2 score 0. Examination chaperoned by Malachy Mood LPN. Discussed perimenopausal symptoms.  She declines labs with year, will get next year  fasting.  Impression: 1. Encounter for well woman exam with routine gynecological exam   2. Irregular periods   3. Essential hypertension   4. Pregnancy examination or test, negative result   5. Perimenopause   6. Screening for colorectal cancer       Plan: Meds ordered this encounter  Medications  . hydrochlorothiazide (MICROZIDE) 12.5 MG capsule    Sig: Take 1 capsule (12.5 mg total) by mouth daily.    Dispense:  90 capsule    Refill:  4    Please consider 90 day supplies to promote better adherence    Order Specific Question:   Supervising Provider    Answer:   Duane Lope H [[id number]]  Pap and physical in 1 year Mammogram yearly Fasting labs in 1 year

## 2019-01-11 ENCOUNTER — Other Ambulatory Visit (HOSPITAL_COMMUNITY): Payer: Self-pay | Admitting: Adult Health

## 2019-01-11 DIAGNOSIS — Z1231 Encounter for screening mammogram for malignant neoplasm of breast: Secondary | ICD-10-CM

## 2019-02-17 ENCOUNTER — Ambulatory Visit (HOSPITAL_COMMUNITY): Payer: BLUE CROSS/BLUE SHIELD

## 2019-02-22 ENCOUNTER — Ambulatory Visit (HOSPITAL_COMMUNITY): Payer: BC Managed Care – PPO

## 2019-03-11 ENCOUNTER — Ambulatory Visit (HOSPITAL_COMMUNITY)
Admission: RE | Admit: 2019-03-11 | Discharge: 2019-03-11 | Disposition: A | Discharge status: Home / Self Care | Payer: BC Managed Care – PPO | Source: Ambulatory Visit | Attending: Adult Health | Admitting: Adult Health

## 2019-03-11 ENCOUNTER — Other Ambulatory Visit: Payer: Self-pay

## 2019-03-11 DIAGNOSIS — Z1231 Encounter for screening mammogram for malignant neoplasm of breast: Secondary | ICD-10-CM | POA: Diagnosis not present

## 2019-03-15 ENCOUNTER — Other Ambulatory Visit (HOSPITAL_COMMUNITY): Payer: Self-pay | Admitting: Adult Health

## 2019-03-15 DIAGNOSIS — R928 Other abnormal and inconclusive findings on diagnostic imaging of breast: Secondary | ICD-10-CM

## 2019-03-23 ENCOUNTER — Other Ambulatory Visit (HOSPITAL_COMMUNITY): Payer: BC Managed Care – PPO

## 2019-03-23 ENCOUNTER — Encounter (HOSPITAL_COMMUNITY): Payer: BC Managed Care – PPO

## 2019-03-26 ENCOUNTER — Telehealth: Payer: Self-pay | Admitting: Adult Health

## 2019-03-26 NOTE — Telephone Encounter (Signed)

## 2019-03-29 ENCOUNTER — Other Ambulatory Visit: Payer: Self-pay

## 2019-03-29 ENCOUNTER — Other Ambulatory Visit (HOSPITAL_COMMUNITY)
Admission: RE | Admit: 2019-03-29 | Discharge: 2019-03-29 | Disposition: A | Discharge status: Home / Self Care | Payer: BC Managed Care – PPO | Source: Ambulatory Visit | Attending: Adult Health | Admitting: Adult Health

## 2019-03-29 ENCOUNTER — Ambulatory Visit (INDEPENDENT_AMBULATORY_CARE_PROVIDER_SITE_OTHER): Payer: BC Managed Care – PPO | Admitting: Adult Health

## 2019-03-29 ENCOUNTER — Encounter: Payer: Self-pay | Admitting: Adult Health

## 2019-03-29 VITALS — BP 140/91 | HR 95 | Ht 62.0 in | Wt 141.5 lb

## 2019-03-29 DIAGNOSIS — I10 Essential (primary) hypertension: Secondary | ICD-10-CM

## 2019-03-29 DIAGNOSIS — Z1211 Encounter for screening for malignant neoplasm of colon: Secondary | ICD-10-CM | POA: Diagnosis not present

## 2019-03-29 DIAGNOSIS — Z1212 Encounter for screening for malignant neoplasm of rectum: Secondary | ICD-10-CM

## 2019-03-29 DIAGNOSIS — Z1321 Encounter for screening for nutritional disorder: Secondary | ICD-10-CM | POA: Diagnosis not present

## 2019-03-29 DIAGNOSIS — Z01419 Encounter for gynecological examination (general) (routine) without abnormal findings: Secondary | ICD-10-CM | POA: Diagnosis not present

## 2019-03-29 DIAGNOSIS — R7309 Other abnormal glucose: Secondary | ICD-10-CM | POA: Diagnosis not present

## 2019-03-29 DIAGNOSIS — N951 Menopausal and female climacteric states: Secondary | ICD-10-CM

## 2019-03-29 LAB — HEMOCCULT GUIAC POC 1CARD (OFFICE): Fecal Occult Blood, POC: NEGATIVE

## 2019-03-29 MED ORDER — HYDROCHLOROTHIAZIDE 12.5 MG PO CAPS: 12.5000 mg | ORAL_CAPSULE | Freq: Every day | ORAL | 4 refills | Status: DC

## 2019-03-29 NOTE — Progress Notes (Signed)
Patient ID: Alicia Wall, female   DOB: May 01, 1971, 47 y.o.   MRN: [id number] History of Present Illness: Alicia Wall is a 48 year old black female, single, G0P0, in for a well woman gyn exam and pap. She works 3 am to 3 pm.    Current Medications, Allergies, Past Medical History, Past Surgical History, Family History and Social History were reviewed in [employer] record.     Review of Systems: Patient denies any headaches, hearing loss, fatigue, blurred vision, shortness of breath, chest pain, abdominal pain, problems with bowel movements, urination, or intercourse. No joint pain or mood swings. Uses condoms and periods are regular.    Physical Exam:BP (!) 140/91 (BP Location: Left Arm, Patient Position: Sitting, Cuff Size: Normal)   Pulse 95   Ht 5\' 2"  (1.575 m)   Wt 141 lb 8 oz (64.2 kg)   LMP 03/10/2019 (Exact Date)   BMI 25.88 kg/m  General:  Well developed, well nourished, no acute distress Skin:  Warm and dry Neck:  Midline trachea, normal thyroid, good ROM, no lymphadenopathy Lungs; Clear to auscultation bilaterally Breast:  No dominant palpable mass, retraction, or nipple discharge Cardiovascular: Regular rate and rhythm Abdomen:  Soft, non tender, no hepatosplenomegaly Pelvic:  External genitalia is normal in appearance, no lesions.  The vagina is normal in appearance. Urethra has no lesions or masses. The cervix is smooth, pap with high risk HPV 16/18 genotyping performed.  Uterus is felt to be normal size, shape, and contour.  No adnexal masses or tenderness noted.Bladder is non tender, no masses felt. Rectal: Good sphincter tone, no polyps, or hemorrhoids felt.  Hemoccult negative. Extremities/musculoskeletal:  No swelling or varicosities noted, no clubbing or cyanosis Psych:  No mood changes, alert and cooperative,seems happy Fall risk is low PHQ 2 score is 0 Examination chaperoned by Diona Fanti CMA   Impression and Plan:  1. Encounter  for gynecological examination with Papanicolaou smear of cervix Pap sent  Pap in 3 years if normal Physical in 1 year F/U mammogram tomorrow Check CBC,CMP,TSH and lipids  2. Screening for colorectal cancer  3. Essential hypertension Will refill Microzide Meds ordered this encounter  Medications  . hydrochlorothiazide (MICROZIDE) 12.5 MG capsule    Sig: Take 1 capsule (12.5 mg total) by mouth daily.    Dispense:  90 capsule    Refill:  4    Please consider 90 day supplies to promote better adherence    Order Specific Question:   Supervising Provider    Answer:   Florian Buff [[id number]]  Review handout on DASH diet   4. Perimenopause  5. Elevated hemoglobin A1c Check A1c  6. Encounter for vitamin deficiency screening Check vitamin D

## 2019-03-29 NOTE — Patient Instructions (Signed)

## 2019-03-30 ENCOUNTER — Ambulatory Visit (HOSPITAL_COMMUNITY)
Admission: RE | Admit: 2019-03-30 | Discharge: 2019-03-30 | Disposition: A | Discharge status: Home / Self Care | Payer: BC Managed Care – PPO | Source: Ambulatory Visit | Attending: Adult Health | Admitting: Adult Health

## 2019-03-30 ENCOUNTER — Ambulatory Visit (HOSPITAL_COMMUNITY): Admission: RE | Admit: 2019-03-30 | Payer: BC Managed Care – PPO | Source: Ambulatory Visit

## 2019-03-30 DIAGNOSIS — R921 Mammographic calcification found on diagnostic imaging of breast: Secondary | ICD-10-CM | POA: Diagnosis not present

## 2019-03-30 DIAGNOSIS — R928 Other abnormal and inconclusive findings on diagnostic imaging of breast: Secondary | ICD-10-CM | POA: Insufficient documentation

## 2019-03-30 LAB — COMPREHENSIVE METABOLIC PANEL
ALT: 21 IU/L (ref 0–32)
AST: 24 IU/L (ref 0–40)
Albumin/Globulin Ratio: 1.4 (ref 1.2–2.2)
Albumin: 4.2 g/dL (ref 3.8–4.8)
Alkaline Phosphatase: 59 IU/L (ref 39–117)
BUN/Creatinine Ratio: 20 (ref 9–23)
BUN: 18 mg/dL (ref 6–24)
Bilirubin Total: 0.3 mg/dL (ref 0.0–1.2)
CO2: 24 mmol/L (ref 20–29)
Calcium: 9.4 mg/dL (ref 8.7–10.2)
Chloride: 96 mmol/L (ref 96–106)
Creatinine, Ser: 0.89 mg/dL (ref 0.57–1.00)
GFR calc Af Amer: 88 mL/min/{1.73_m2} (ref >59)
GFR calc non Af Amer: 76 mL/min/{1.73_m2} (ref >59)
Globulin, Total: 3.1 g/dL (ref 1.5–4.5)
Glucose: 96 mg/dL (ref 65–99)
Potassium: 3.6 mmol/L (ref 3.5–5.2)
Sodium: 133 mmol/L — ABNORMAL LOW (ref 134–144)
Total Protein: 7.3 g/dL (ref 6.0–8.5)

## 2019-03-30 LAB — HEMOGLOBIN A1C
Est. average glucose Bld gHb Est-mCnc: 117 mg/dL
Hgb A1c MFr Bld: 5.7 % — ABNORMAL HIGH (ref 4.8–5.6)

## 2019-03-30 LAB — LIPID PANEL
Chol/HDL Ratio: 2.6 ratio (ref 0.0–4.4)
Cholesterol, Total: 203 mg/dL — ABNORMAL HIGH (ref 100–199)
HDL: 77 mg/dL (ref >39)
LDL Chol Calc (NIH): 98 mg/dL (ref 0–99)
Triglycerides: 165 mg/dL — ABNORMAL HIGH (ref 0–149)
VLDL Cholesterol Cal: 28 mg/dL (ref 5–40)

## 2019-03-30 LAB — CBC
Hematocrit: 41.6 % (ref 34.0–46.6)
Hemoglobin: 14 g/dL (ref 11.1–15.9)
MCH: 27.3 pg (ref 26.6–33.0)
MCHC: 33.7 g/dL (ref 31.5–35.7)
MCV: 81 fL (ref 79–97)
Platelets: 331 10*3/uL (ref 150–450)
RBC: 5.12 x10E6/uL (ref 3.77–5.28)
RDW: 13.5 % (ref 11.7–15.4)
WBC: 7.8 10*3/uL (ref 3.4–10.8)

## 2019-03-30 LAB — VITAMIN D 25 HYDROXY (VIT D DEFICIENCY, FRACTURES): Vit D, 25-Hydroxy: 48.6 ng/mL (ref 30.0–100.0)

## 2019-03-30 LAB — TSH: TSH: 2.59 u[IU]/mL (ref 0.450–4.500)

## 2019-03-31 ENCOUNTER — Telehealth: Payer: Self-pay | Admitting: Adult Health

## 2019-03-31 NOTE — Telephone Encounter (Signed)
Pt aware of labs and need for Follow up mammogram on left in 6 months

## 2019-04-05 LAB — CYTOLOGY - PAP
Adequacy: ABSENT
Comment: NEGATIVE
Diagnosis: NEGATIVE
High risk HPV: NEGATIVE

## 2019-04-14 ENCOUNTER — Encounter: Payer: Self-pay | Admitting: *Deleted

## 2019-08-21 ENCOUNTER — Ambulatory Visit: Payer: BC Managed Care – PPO | Attending: Internal Medicine

## 2019-08-21 DIAGNOSIS — Z23 Encounter for immunization: Secondary | ICD-10-CM

## 2019-08-21 NOTE — Progress Notes (Signed)
   Covid-19 Vaccination Clinic  Name:  Alicia Wall    MRN: [id number] DOB: [birth year]  08/21/2019  Alicia Wall was observed post Covid-19 immunization for 15 minutes without incident. She was provided with Vaccine Information Sheet and instruction to access the V-Safe system.   Alicia Wall was instructed to call 911 with any severe reactions post vaccine: Marland Kitchen Difficulty breathing  . Swelling of face and throat  . A fast heartbeat  . A bad rash all over body  . Dizziness and weakness   Immunizations Administered    Name Date Dose VIS Date Route   Moderna COVID-19 Vaccine 08/21/2019 12:20 PM 0.5 mL 04/13/2019 Intramuscular   Manufacturer: Moderna   Lot: [id number]   NDC: 97741-423-95

## 2019-09-18 ENCOUNTER — Ambulatory Visit: Payer: BC Managed Care – PPO | Attending: Internal Medicine

## 2019-09-18 DIAGNOSIS — Z23 Encounter for immunization: Secondary | ICD-10-CM

## 2019-09-18 NOTE — Progress Notes (Signed)
   Covid-19 Vaccination Clinic  Name:  Alicia Wall    MRN: [id number] DOB: [birth year]  09/18/2019  [name] was observed post Covid-19 immunization for 15 minutes without incident. She was provided with Vaccine Information Sheet and instruction to access the V-Safe system.   [name] was instructed to call 911 with any severe reactions post vaccine: Marland Kitchen Difficulty breathing  . Swelling of face and throat  . A fast heartbeat  . A bad rash all over body  . Dizziness and weakness   Immunizations Administered    Name Date Dose VIS Date Route   Moderna COVID-19 Vaccine 09/18/2019 12:21 PM 0.5 mL 04/2019 Intramuscular   Manufacturer: Moderna   Lot: [id number]   NDC: 59292-446-28

## 2020-05-01 ENCOUNTER — Ambulatory Visit (INDEPENDENT_AMBULATORY_CARE_PROVIDER_SITE_OTHER): Payer: BC Managed Care – PPO | Admitting: Adult Health

## 2020-05-01 ENCOUNTER — Encounter: Payer: Self-pay | Admitting: Adult Health

## 2020-05-01 ENCOUNTER — Other Ambulatory Visit: Payer: Self-pay

## 2020-05-01 VITALS — BP 141/90 | HR 109 | Ht 62.0 in | Wt 147.6 lb

## 2020-05-01 DIAGNOSIS — Z1211 Encounter for screening for malignant neoplasm of colon: Secondary | ICD-10-CM

## 2020-05-01 DIAGNOSIS — N951 Menopausal and female climacteric states: Secondary | ICD-10-CM

## 2020-05-01 DIAGNOSIS — Z01419 Encounter for gynecological examination (general) (routine) without abnormal findings: Secondary | ICD-10-CM | POA: Diagnosis not present

## 2020-05-01 DIAGNOSIS — Z87898 Personal history of other specified conditions: Secondary | ICD-10-CM

## 2020-05-01 DIAGNOSIS — Z1212 Encounter for screening for malignant neoplasm of rectum: Secondary | ICD-10-CM

## 2020-05-01 DIAGNOSIS — I10 Essential (primary) hypertension: Secondary | ICD-10-CM

## 2020-05-01 LAB — HEMOCCULT GUIAC POC 1CARD (OFFICE): Fecal Occult Blood, POC: NEGATIVE

## 2020-05-01 MED ORDER — HYDROCHLOROTHIAZIDE 12.5 MG PO CAPS
12.5000 mg | ORAL_CAPSULE | Freq: Every day | ORAL | 4 refills | Status: DC
Start: 2020-05-01 — End: 2021-05-18

## 2020-05-01 NOTE — Progress Notes (Signed)
Patient ID: Alicia Wall, female   DOB: 1970/10/12, 49 y.o.   MRN: [id number] History of Present Illness: Alicia Wall is a 49 year old black female, single, G0P0, in for a well woman exam,she had a normal pap with negative HPV 03/28/20. She works 12 hours 3a-3p.   Current Medications, Allergies, Past Medical History, Past Surgical History, Family History and Social History were reviewed in [employer] record.     Review of Systems: Patient denies any headaches, hearing loss, fatigue, blurred vision, shortness of breath, chest pain, abdominal pain, problems with bowel movements, urination, or intercourse. No joint pain or mood swings.    Physical Exam:BP (!) 141/90 (BP Location: Right Arm, Patient Position: Sitting, Cuff Size: Normal)   Pulse (!) 109   Ht 5\' 2"  (1.575 m)   Wt 147 lb 9.6 oz (67 kg)   LMP 03/27/2020 (Approximate)   BMI 27.00 kg/m  General:  Well developed, well nourished, no acute distress Skin:  Warm and dry Neck:  Midline trachea, normal thyroid, good ROM, no lymphadenopathy Lungs; Clear to auscultation bilaterally Breast:  No dominant palpable mass, retraction, or nipple discharge Cardiovascular: Regular rate and rhythm Abdomen:  Soft, non tender, no hepatosplenomegaly Pelvic:  External genitalia is normal in appearance, no lesions.  The vagina is normal in appearance. Urethra has no lesions or masses. The cervix is smooth.  Uterus is felt to be normal size, shape, and contour.  No adnexal masses or tenderness noted.Bladder is non tender, no masses felt. Rectal: Good sphincter tone, no polyps, or hemorrhoids felt.  Hemoccult negative. Extremities/musculoskeletal:  No swelling or varicosities noted, no clubbing or cyanosis Psych:  No mood changes, alert and cooperative,seems happy AA is 3 Fall risk is low PHQ 9 score is 0 GAD 7 score is 0  Upstream - 05/01/20 1529      Pregnancy Intention Screening   Does the patient want to become  pregnant in the next year? No    Does the patient's partner want to become pregnant in the next year? No    Would the patient like to discuss contraceptive options today? No      Contraception Wrap Up   Current Method Female Condom    End Method Female Condom    Contraception Counseling Provided No         Examination chaperoned by 05/03/20 RN  Impression and Plan: 1. Encounter for well woman exam with routine gynecological exam Physical in 1 year Pap in 2023 Fasting labs in 2023   2. Encounter for screening fecal occult blood testing  3. History of abnormal mammogram Diagnostic [facility] scheduled [facility] for her 05/23/20 at 3:40 pm  4. Encounter for colorectal cancer screening Referred to Dr 07/21/20 office   5. Essential hypertension Refilled microzide, take daily Meds ordered this encounter  Medications  . hydrochlorothiazide (MICROZIDE) 12.5 MG capsule    Sig: Take 1 capsule (12.5 mg total) by mouth daily.    Dispense:  90 capsule    Refill:  4    Please consider 90 day supplies to promote better adherence    Order Specific Question:   Supervising Provider    Answer:   Patty Sermons H [[id number]]    6. Perimenopause

## 2020-05-02 ENCOUNTER — Encounter (INDEPENDENT_AMBULATORY_CARE_PROVIDER_SITE_OTHER): Payer: Self-pay | Admitting: *Deleted

## 2020-05-23 ENCOUNTER — Encounter (HOSPITAL_COMMUNITY): Payer: BC Managed Care – PPO

## 2020-05-23 ENCOUNTER — Ambulatory Visit (HOSPITAL_COMMUNITY): Payer: BC Managed Care – PPO

## 2020-05-23 ENCOUNTER — Ambulatory Visit (HOSPITAL_COMMUNITY): Admission: RE | Admit: 2020-05-23 | Payer: BC Managed Care – PPO | Source: Ambulatory Visit

## 2020-07-30 IMAGING — MG DIGITAL SCREENING BILATERAL MAMMOGRAM WITH TOMO AND CAD
8 series · 8 of 24 positions shown · non-contrast
Comparison: Previous exam(s).

CLINICAL DATA: Screening.

EXAM:
DIGITAL SCREENING BILATERAL MAMMOGRAM WITH TOMO AND CAD

[L CC synth-2D]
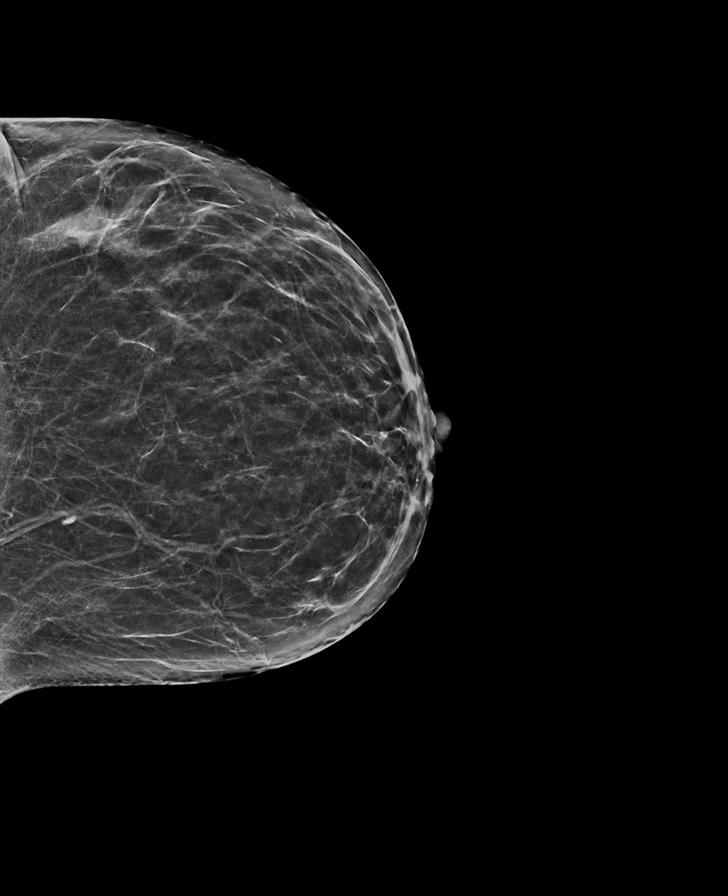

[R CC synth-2D]
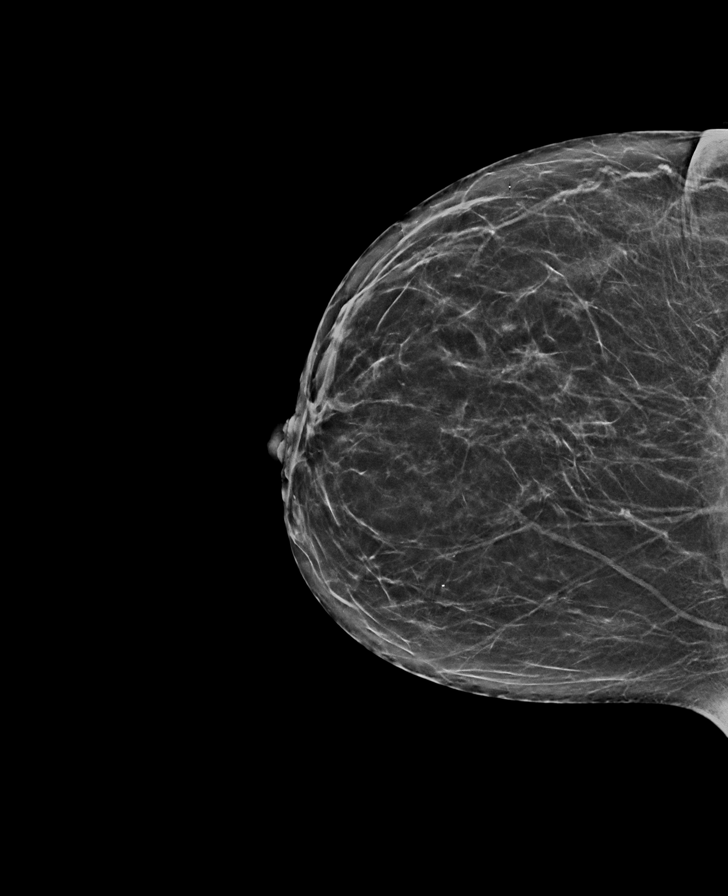

[L MLO synth-2D]
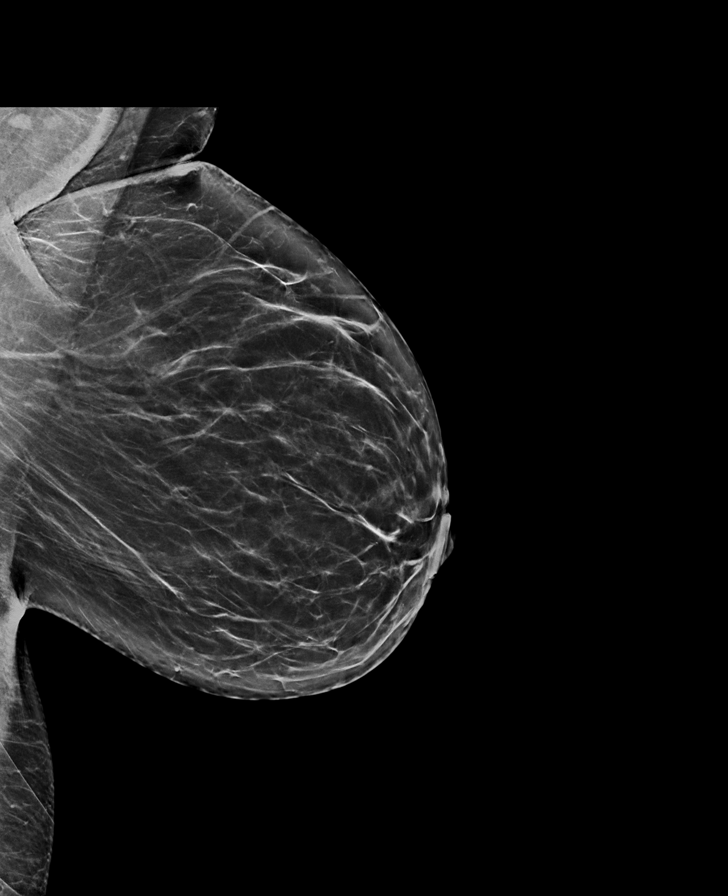

[R MLO synth-2D]
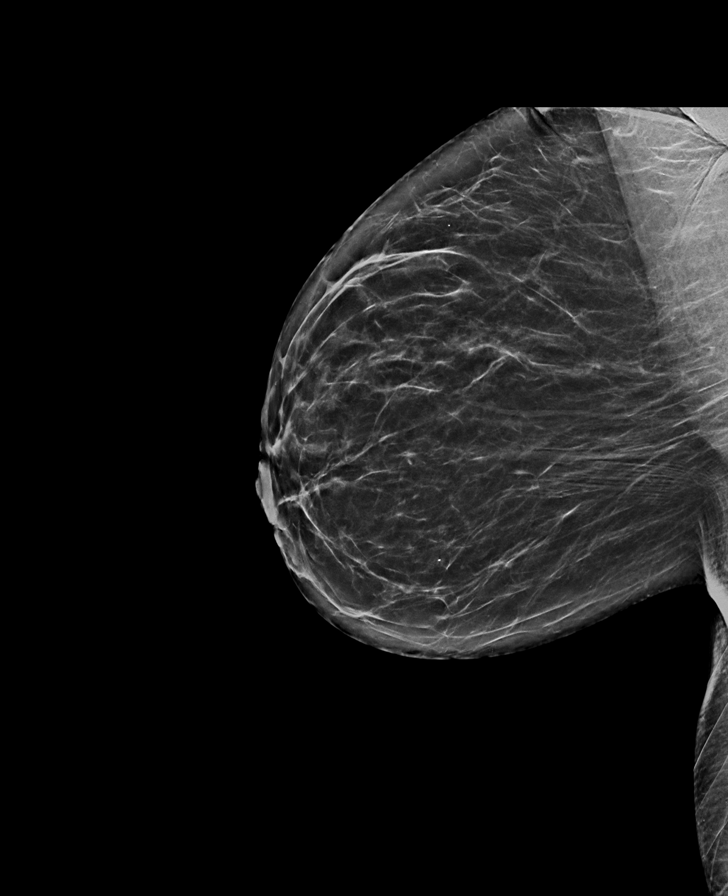

[R MLO tomo · tomo slice 33/66.0]
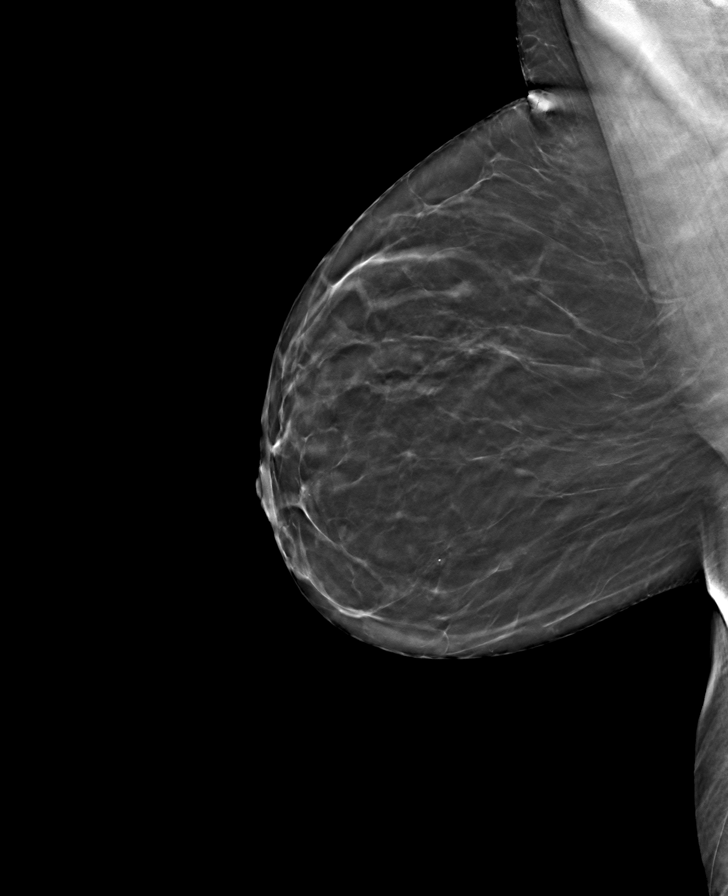

[L CC tomo · tomo slice 30/59.0]
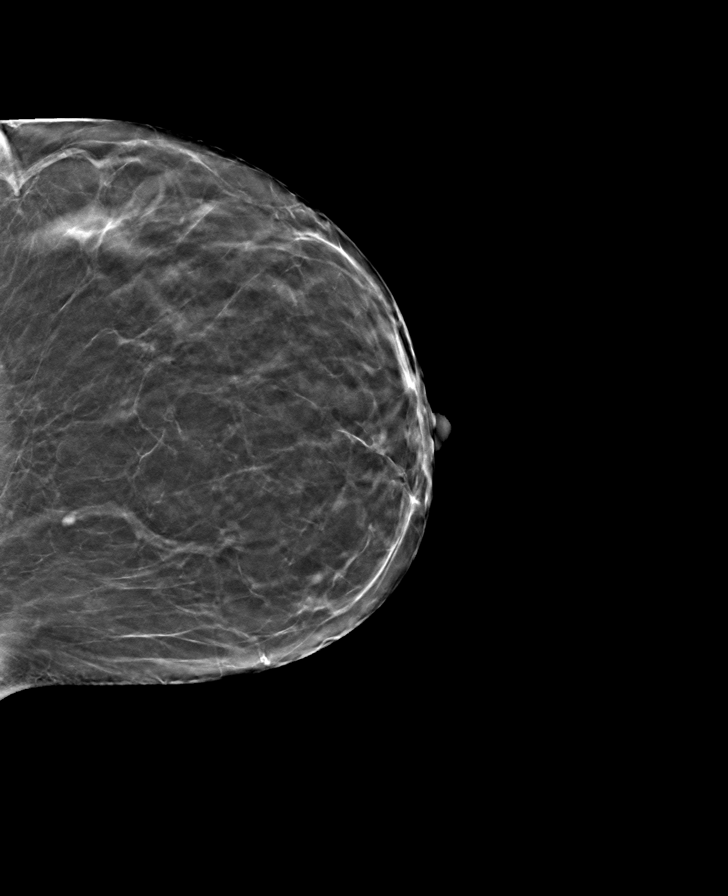

[R CC tomo · tomo slice 31/60.0]
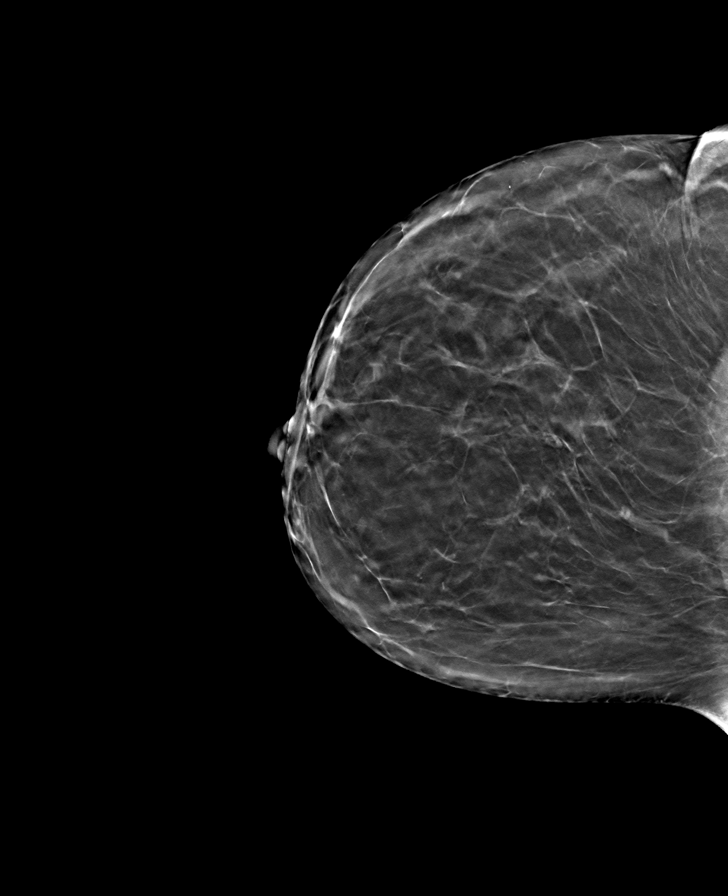

[L MLO tomo · tomo slice 36/71.0]
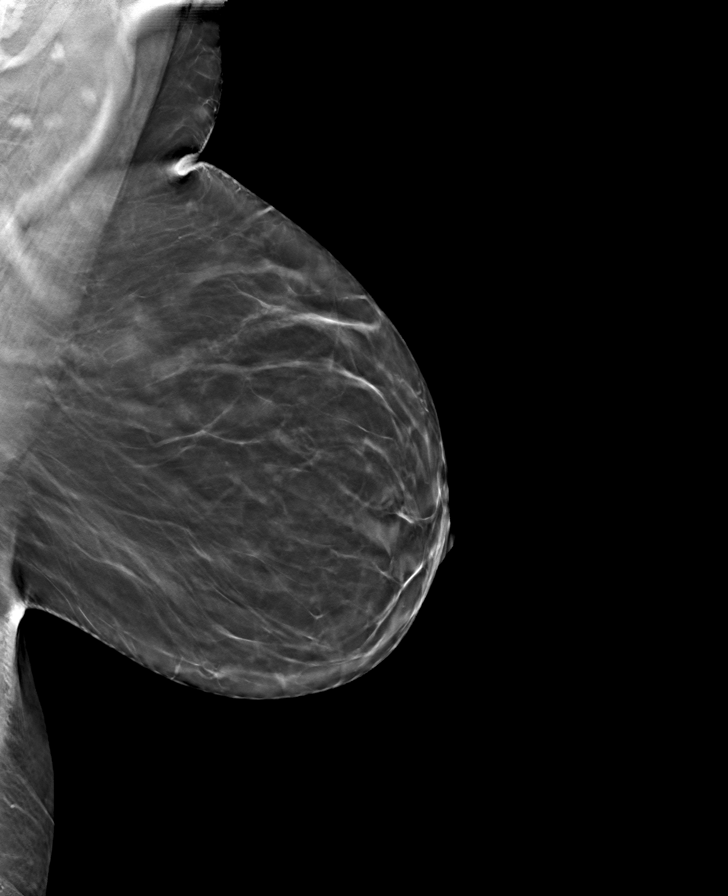

[8 of 24 positions shown; findings below may reference images not displayed]

ACR Breast Density Category b: There are scattered areas of
fibroglandular density.
FINDINGS: There are no findings suspicious for malignancy. Images were
processed with CAD.
IMPRESSION: No mammographic evidence of malignancy. A result letter of this
screening mammogram will be mailed directly to the patient.

RECOMMENDATION:
Screening mammogram in one year. (Code:[id number])

BI-RADS CATEGORY  1: Negative.

## 2021-03-14 DIAGNOSIS — Z1231 Encounter for screening mammogram for malignant neoplasm of breast: Secondary | ICD-10-CM | POA: Diagnosis not present

## 2021-04-24 ENCOUNTER — Other Ambulatory Visit: Payer: BC Managed Care – PPO | Admitting: Adult Health

## 2021-05-18 ENCOUNTER — Other Ambulatory Visit: Payer: Self-pay

## 2021-05-18 ENCOUNTER — Other Ambulatory Visit (HOSPITAL_COMMUNITY)
Admission: RE | Admit: 2021-05-18 | Discharge: 2021-05-18 | Disposition: A | Discharge status: Home / Self Care | Payer: BC Managed Care – PPO | Source: Ambulatory Visit | Attending: Adult Health | Admitting: Adult Health

## 2021-05-18 ENCOUNTER — Ambulatory Visit (INDEPENDENT_AMBULATORY_CARE_PROVIDER_SITE_OTHER): Payer: BC Managed Care – PPO | Admitting: Adult Health

## 2021-05-18 ENCOUNTER — Encounter: Payer: Self-pay | Admitting: Adult Health

## 2021-05-18 VITALS — BP 129/78 | HR 82 | Ht 62.0 in | Wt 158.5 lb

## 2021-05-18 DIAGNOSIS — Z01419 Encounter for gynecological examination (general) (routine) without abnormal findings: Secondary | ICD-10-CM | POA: Insufficient documentation

## 2021-05-18 DIAGNOSIS — E782 Mixed hyperlipidemia: Secondary | ICD-10-CM | POA: Insufficient documentation

## 2021-05-18 DIAGNOSIS — N951 Menopausal and female climacteric states: Secondary | ICD-10-CM

## 2021-05-18 DIAGNOSIS — Z13220 Encounter for screening for lipoid disorders: Secondary | ICD-10-CM

## 2021-05-18 DIAGNOSIS — Z1211 Encounter for screening for malignant neoplasm of colon: Secondary | ICD-10-CM

## 2021-05-18 DIAGNOSIS — Z131 Encounter for screening for diabetes mellitus: Secondary | ICD-10-CM

## 2021-05-18 DIAGNOSIS — I10 Essential (primary) hypertension: Secondary | ICD-10-CM

## 2021-05-18 LAB — HEMOCCULT GUIAC POC 1CARD (OFFICE): Fecal Occult Blood, POC: NEGATIVE

## 2021-05-18 MED ORDER — HYDROCHLOROTHIAZIDE 12.5 MG PO CAPS: 12.5000 mg | ORAL_CAPSULE | Freq: Every day | ORAL | 4 refills | Status: DC

## 2021-05-18 NOTE — Progress Notes (Signed)
Patient ID: Alicia Wall, female   DOB: 09/11/1970, 51 y.o.   MRN: [id number] History of Present Illness:  Alicia Wall is a 51 year old black female,single, G0P0, in for a well woman gyn exam and pap. She is working 12 hour\ shifts.  No current PCP.  Current Medications, Allergies, Past Medical History, Past Surgical History, Family History and Social History were reviewed in [employer] record.     Review of Systems: Patient denies any headaches, hearing loss, fatigue, blurred vision, shortness of breath, chest pain, abdominal pain, problems with bowel movements, urination, or intercourse. No joint pain or mood swings.  She had lite short period in November and skipped December and has had hot flash.   Physical Exam:BP 129/78 (BP Location: Left Arm, Patient Position: Sitting, Cuff Size: Normal)    Pulse 82    Ht 5\' 2"  (1.575 m)    Wt 158 lb 8 oz (71.9 kg)    BMI 28.99 kg/m   General:  Well developed, well nourished, no acute distress Skin:  Warm and dry Neck:  Midline trachea, normal thyroid, good ROM, no lymphadenopathy Lungs; Clear to auscultation bilaterally Breast:  No dominant palpable mass, retraction, or nipple discharge Cardiovascular: Regular rate and rhythm Abdomen:  Soft, non tender, no hepatosplenomegaly Pelvic:  External genitalia is normal in appearance, no lesions.  The vagina is normal in appearance. Urethra has no lesions or masses. The cervix is smooth, pap with HR HPV genotyping performed. Uterus is felt to be normal size, shape, and contour.  No adnexal masses or tenderness noted.Bladder is non tender, no masses felt. Rectal: Good sphincter tone, no polyps, or hemorrhoids felt.  Hemoccult negative. Extremities/musculoskeletal:  No swelling or varicosities noted, no clubbing or cyanosis Psych:  No mood changes, alert and cooperative,seems happy AA is 2 Fall risk is low Depression screen [facility] 2/9 05/18/2021 05/01/2020 03/29/2019  Decreased  Interest 0 0 0  Down, Depressed, Hopeless 0 0 0  PHQ - 2 Score 0 0 0  Altered sleeping 0 0 -  Tired, decreased energy 0 0 -  Change in appetite 0 0 -  Feeling bad or failure about yourself  0 0 -  Trouble concentrating 0 0 -  Moving slowly or fidgety/restless 0 0 -  Suicidal thoughts 0 - -  PHQ-9 Score 0 0 -    GAD 7 : Generalized Anxiety Score 05/18/2021 05/01/2020  Nervous, Anxious, on Edge 0 0  Control/stop worrying 0 0  Worry too much - different things 0 0  Trouble relaxing 0 0  Restless 0 0  Easily annoyed or irritable 0 0  Afraid - awful might happen 0 0  Total GAD 7 Score 0 0    Upstream - 05/18/21 1201       Pregnancy Intention Screening   Does the patient want to become pregnant in the next year? No    Does the patient's partner want to become pregnant in the next year? No    Would the patient like to discuss contraceptive options today? No      Contraception Wrap Up   Current Method Female Condom    End Method Female Condom              Examination chaperoned by 07/16/21 RN   Impression and Plan: 1. Encounter for gynecological examination with Papanicolaou smear of cervix Pap sent Physical in 1 year Pap in 3 if normal Will check labs  - Cytology - PAP( [place]) -  CBC - Comprehensive metabolic panel - Lipid panel  2. Essential hypertension Continue Microzide Meds ordered this encounter  Medications   hydrochlorothiazide (MICROZIDE) 12.5 MG capsule    Sig: Take 1 capsule (12.5 mg total) by mouth daily.    Dispense:  90 capsule    Refill:  4    Please consider 90 day supplies to promote better adherence    Order Specific Question:   Supervising Provider    Answer:   Duane Lope H [[id number]]    - Comprehensive metabolic panel  3. Perimenopause Discussed skipping periods and hot flashes   4. Encounter for screening fecal occult blood testing - POCT occult blood stool  5. Screening cholesterol level - Lipid panel  6. Screening for diabetes  mellitus  - Hemoglobin A1c

## 2021-05-21 DIAGNOSIS — Z131 Encounter for screening for diabetes mellitus: Secondary | ICD-10-CM | POA: Diagnosis not present

## 2021-05-21 DIAGNOSIS — Z13220 Encounter for screening for lipoid disorders: Secondary | ICD-10-CM | POA: Diagnosis not present

## 2021-05-21 DIAGNOSIS — I10 Essential (primary) hypertension: Secondary | ICD-10-CM | POA: Diagnosis not present

## 2021-05-21 DIAGNOSIS — Z01419 Encounter for gynecological examination (general) (routine) without abnormal findings: Secondary | ICD-10-CM | POA: Diagnosis not present

## 2021-05-22 LAB — LIPID PANEL
Chol/HDL Ratio: 3.1 ratio (ref 0.0–4.4)
Cholesterol, Total: 244 mg/dL — ABNORMAL HIGH (ref 100–199)
HDL: 80 mg/dL (ref >39)
LDL Chol Calc (NIH): 146 mg/dL — ABNORMAL HIGH (ref 0–99)
Triglycerides: 107 mg/dL (ref 0–149)
VLDL Cholesterol Cal: 18 mg/dL (ref 5–40)

## 2021-05-22 LAB — HEMOGLOBIN A1C
Est. average glucose Bld gHb Est-mCnc: 114 mg/dL
Hgb A1c MFr Bld: 5.6 % (ref 4.8–5.6)

## 2021-05-22 LAB — COMPREHENSIVE METABOLIC PANEL
ALT: 16 IU/L (ref 0–32)
AST: 22 IU/L (ref 0–40)
Albumin/Globulin Ratio: 1.7 (ref 1.2–2.2)
Albumin: 4.5 g/dL (ref 3.8–4.9)
Alkaline Phosphatase: 67 IU/L (ref 44–121)
BUN/Creatinine Ratio: 19 (ref 9–23)
BUN: 18 mg/dL (ref 6–24)
Bilirubin Total: 0.3 mg/dL (ref 0.0–1.2)
CO2: 22 mmol/L (ref 20–29)
Calcium: 9.3 mg/dL (ref 8.7–10.2)
Chloride: 99 mmol/L (ref 96–106)
Creatinine, Ser: 0.96 mg/dL (ref 0.57–1.00)
Globulin, Total: 2.6 g/dL (ref 1.5–4.5)
Glucose: 78 mg/dL (ref 70–99)
Potassium: 3.8 mmol/L (ref 3.5–5.2)
Sodium: 136 mmol/L (ref 134–144)
Total Protein: 7.1 g/dL (ref 6.0–8.5)
eGFR: 72 mL/min/{1.73_m2} (ref >59)

## 2021-05-22 LAB — CBC
Hematocrit: 40.3 % (ref 34.0–46.6)
Hemoglobin: 13.4 g/dL (ref 11.1–15.9)
MCH: 27.6 pg (ref 26.6–33.0)
MCHC: 33.3 g/dL (ref 31.5–35.7)
MCV: 83 fL (ref 79–97)
Platelets: 313 10*3/uL (ref 150–450)
RBC: 4.85 x10E6/uL (ref 3.77–5.28)
RDW: 13.8 % (ref 11.7–15.4)
WBC: 6.9 10*3/uL (ref 3.4–10.8)

## 2021-05-22 LAB — CYTOLOGY - PAP
Comment: NEGATIVE
Diagnosis: NEGATIVE
Diagnosis: REACTIVE
High risk HPV: NEGATIVE

## 2021-05-24 ENCOUNTER — Other Ambulatory Visit: Payer: Self-pay | Admitting: Adult Health

## 2021-05-24 MED ORDER — METRONIDAZOLE 500 MG PO TABS: 500.0000 mg | ORAL_TABLET | Freq: Two times a day (BID) | ORAL | 0 refills | Status: DC

## 2021-05-24 NOTE — Progress Notes (Signed)
+  BV and trich on pap, will rx flagyl

## 2021-05-25 ENCOUNTER — Telehealth: Payer: Self-pay | Admitting: Adult Health

## 2021-05-25 NOTE — Telephone Encounter (Signed)
Left message about labs and pap, and that flagyl sent in for BV and trich, no sex or alcohol during treatment and that partner needs treating just let me know

## 2021-08-21 ENCOUNTER — Other Ambulatory Visit: Payer: Self-pay | Admitting: Adult Health

## 2023-06-06 DIAGNOSIS — I10 Essential (primary) hypertension: Secondary | ICD-10-CM | POA: Diagnosis not present

## 2023-06-06 DIAGNOSIS — M25462 Effusion, left knee: Secondary | ICD-10-CM | POA: Diagnosis not present

## 2023-06-06 DIAGNOSIS — M25562 Pain in left knee: Secondary | ICD-10-CM | POA: Diagnosis not present

## 2023-07-29 ENCOUNTER — Ambulatory Visit (INDEPENDENT_AMBULATORY_CARE_PROVIDER_SITE_OTHER): Payer: Self-pay | Admitting: Internal Medicine

## 2023-07-29 ENCOUNTER — Encounter: Payer: Self-pay | Admitting: Internal Medicine

## 2023-07-29 ENCOUNTER — Telehealth: Payer: Self-pay | Admitting: Internal Medicine

## 2023-07-29 VITALS — BP 138/82 | HR 94 | Ht 62.0 in | Wt 172.2 lb

## 2023-07-29 DIAGNOSIS — Z114 Encounter for screening for human immunodeficiency virus [HIV]: Secondary | ICD-10-CM

## 2023-07-29 DIAGNOSIS — E559 Vitamin D deficiency, unspecified: Secondary | ICD-10-CM

## 2023-07-29 DIAGNOSIS — Z1231 Encounter for screening mammogram for malignant neoplasm of breast: Secondary | ICD-10-CM

## 2023-07-29 DIAGNOSIS — I10 Essential (primary) hypertension: Secondary | ICD-10-CM | POA: Diagnosis not present

## 2023-07-29 DIAGNOSIS — M25562 Pain in left knee: Secondary | ICD-10-CM

## 2023-07-29 DIAGNOSIS — Z0001 Encounter for general adult medical examination with abnormal findings: Secondary | ICD-10-CM | POA: Diagnosis not present

## 2023-07-29 DIAGNOSIS — Z131 Encounter for screening for diabetes mellitus: Secondary | ICD-10-CM | POA: Diagnosis not present

## 2023-07-29 DIAGNOSIS — E782 Mixed hyperlipidemia: Secondary | ICD-10-CM

## 2023-07-29 DIAGNOSIS — Z13220 Encounter for screening for lipoid disorders: Secondary | ICD-10-CM | POA: Diagnosis not present

## 2023-07-29 DIAGNOSIS — Z1159 Encounter for screening for other viral diseases: Secondary | ICD-10-CM

## 2023-07-29 DIAGNOSIS — Z1211 Encounter for screening for malignant neoplasm of colon: Secondary | ICD-10-CM | POA: Insufficient documentation

## 2023-07-29 DIAGNOSIS — G8929 Other chronic pain: Secondary | ICD-10-CM | POA: Insufficient documentation

## 2023-07-29 NOTE — Progress Notes (Signed)
 New Patient Office Visit  Subjective:  Patient ID: Alicia Wall, female    DOB: [birth year]  Age: 53 y.o. MRN: [id number]  CC:  Chief Complaint  Patient presents with   Establish Care    New patient establishing care    HPI Alicia Wall is a 53 y.o. female with past medical history of HTN and HLD who presents for establishing care.  HTN: Her BP was borderline elevated today.  She used to take HCTZ 12.5 mg once daily, but had stopped taking it in 2024 as she had lost 35 lbs weight and she thought she would not need it.  She did not get medical evaluation after stopping HCTZ.  She denies any headache, dizziness, chest pain, dyspnea or palpitations. Of note, she has gained 25 lbs weight out of the previous weight loss, but she is going to try to follow DASH diet to control BP and lose weight.  HLD: LDL was 146 in 2023.  Prefers lifestyle modification for now.    Past Medical History:  Diagnosis Date   History of trichomoniasis    Hypertension    Trichomonas infection 03/14/2014    Past Surgical History:  Procedure Laterality Date   IRRIGATION AND DEBRIDEMENT ABSCESS Left 04/16/2017   Procedure: MINOR REMOVAL OF FOREIGN BODY NIPPLE;  Surgeon: Lucretia Roers, MD;  Location: [facility];  Service: General;  Laterality: Left;  office LM for pt to arrive at 9:15    Family History  Problem Relation Age of Onset   Heart attack Father    Diabetes Father    Hypertension Paternal Grandfather    Diabetes Paternal Grandmother    Hypertension Paternal Grandmother    Hypertension Maternal Grandmother    Hypertension Maternal Grandfather     Social History   Socioeconomic History   Marital status: Single    Spouse name: Not on file   Number of children: 0   Years of education: Not on file   Highest education level: Not on file  Occupational History   Not on file  Tobacco Use   Smoking status: Former    Current packs/day: 0.00    Types: Cigarettes    Start date:  02/22/1993    Quit date: 02/23/2003    Years since quitting: 20.4   Smokeless tobacco: Never  Vaping Use   Vaping status: Never Used  Substance and Sexual Activity   Alcohol use: Yes    Alcohol/week: 2.0 standard drinks of alcohol    Types: 2 Standard drinks or equivalent per week    Comment:  on weekends   Drug use: No   Sexual activity: Yes    Birth control/protection: Condom, None  Other Topics Concern   Not on file  Social History Narrative   Not on file   Social Drivers of Health   Financial Resource Strain: Low Risk  (05/18/2021)   Overall Financial Resource Strain (CARDIA)    Difficulty of Paying Living Expenses: Not very hard  Food Insecurity: No Food Insecurity (05/18/2021)   Hunger Vital Sign    Worried About Running Out of Food in the Last Year: Never true    Ran Out of Food in the Last Year: Never true  Transportation Needs: No Transportation Needs (05/18/2021)   PRAPARE - Administrator, Civil Service (Medical): No    Lack of Transportation (Non-Medical): No  Physical Activity: Insufficiently Active (05/18/2021)   Exercise Vital Sign    Days of Exercise per Week: 2 days  Minutes of Exercise per Session: 30 min  Stress: No Stress Concern Present (05/18/2021)   [employer] of Occupational Health - Occupational Stress Questionnaire    Feeling of Stress : Only a little  Social Connections: Unknown (09/25/2021)   Received from [facility], [facility]   Social Network    Social Network: Not on file  Intimate Partner Violence: Unknown (08/17/2021)   Received from [facility], [facility]   HITS    Physically Hurt: Not on file    Insult or Talk Down To: Not on file    Threaten Physical Harm: Not on file    Scream or Curse: Not on file    ROS Review of Systems  Constitutional:  Negative for chills and fever.  HENT:  Negative for congestion, sinus pressure, sinus pain and sore throat.   Eyes:  Negative for pain and discharge.  Respiratory:   Negative for cough and shortness of breath.   Cardiovascular:  Negative for chest pain and palpitations.  Gastrointestinal:  Negative for abdominal pain, constipation, diarrhea, nausea and vomiting.  Endocrine: Negative for polydipsia and polyuria.  Genitourinary:  Negative for dysuria and hematuria.  Musculoskeletal:  Positive for arthralgias (Left knee). Negative for neck pain and neck stiffness.  Skin:  Negative for rash.  Neurological:  Negative for dizziness and weakness.  Psychiatric/Behavioral:  Negative for agitation and behavioral problems.     Objective:   Today's Vitals: BP 138/82 (BP Location: Left Arm)   Pulse 94   Ht 5\' 2"  (1.575 m)   Wt 172 lb 3.2 oz (78.1 kg)   SpO2 100%   BMI 31.50 kg/m   Physical Exam Vitals reviewed.  Constitutional:      General: She is not in acute distress.    Appearance: She is not diaphoretic.  HENT:     Head: Normocephalic and atraumatic.     Nose: Nose normal.     Mouth/Throat:     Mouth: Mucous membranes are moist.  Eyes:     General: No scleral icterus.    Extraocular Movements: Extraocular movements intact.  Cardiovascular:     Rate and Rhythm: Normal rate and regular rhythm.     Heart sounds: Normal heart sounds. No murmur heard. Pulmonary:     Breath sounds: Normal breath sounds. No wheezing or rales.  Abdominal:     Palpations: Abdomen is soft.     Tenderness: There is no abdominal tenderness.  Musculoskeletal:     Cervical back: Neck supple. No tenderness.     Left knee: No swelling. Normal range of motion. No tenderness.     Right lower leg: No edema.     Left lower leg: No edema.  Skin:    General: Skin is warm.     Findings: No rash.  Neurological:     General: No focal deficit present.     Mental Status: She is alert and oriented to person, place, and time.     Cranial Nerves: No cranial nerve deficit.     Sensory: No sensory deficit.     Motor: No weakness.  Psychiatric:        Mood and Affect: Mood  normal.        Behavior: Behavior normal.     Assessment & Plan:   Problem List Items Addressed This Visit       Cardiovascular and Mediastinum   Essential hypertension   BP Readings from Last 1 Encounters:  07/29/23 138/82   Well-controlled overall with lifestyle  modification alone, DASH diet would even improve further DC hydrochlorothiazide for now - if elevated BP in the next visit, will restart hydrochlorothiazide Advised DASH diet and moderate exercise/walking, at least 150 mins/week       Relevant Orders   TSH   CMP14+EGFR   CBC with Differential/Platelet     Other   Screening for diabetes mellitus   Relevant Orders   Hemoglobin A1c   Mixed hyperlipidemia   Check lipid profile Advised to follow DASH diet      Relevant Orders   Lipid panel   Colon cancer screening   Discussed about colonoscopy and cologuard - benefits of each procedure discussed. Patient prefers Cologuard - ordered.      Relevant Orders   Cologuard   Encounter for general adult medical examination with abnormal findings - Primary   Physical exam as documented. Counseling done  re healthy lifestyle involving commitment to 150 minutes exercise per week, heart healthy diet, and attaining healthy weight.The importance of adequate sleep also discussed. Changes in health habits are decided on by the patient with goals and time frames  set for achieving them. Immunization and cancer screening needs are specifically addressed at this visit.      Chronic pain of left knee   Mild, chronic left knee pain, intermittent Advised to take Tylenol as needed for pain If persistent, can consider getting x-ray of knee Advised to perform simple knee exercises      Other Visit Diagnoses       Need for hepatitis C screening test       Relevant Orders   Hepatitis C Antibody     Encounter for screening for HIV       Relevant Orders   HIV antibody (with reflex)     Vitamin D deficiency       Relevant  Orders   VITAMIN D 25 Hydroxy (Vit-D Deficiency, Fractures)     Encounter for screening mammogram for malignant neoplasm of breast       Relevant Orders   MM 3D DIAGNOSTIC MAMMOGRAM BILATERAL BREAST   [place] LIMITED ULTRASOUND INCLUDING AXILLA LEFT BREAST    [place] LIMITED ULTRASOUND INCLUDING AXILLA RIGHT BREAST       Outpatient Encounter Medications as of 07/29/2023  Medication Sig   ibuprofen (ADVIL) 200 MG tablet Take 400 mg by mouth as needed.   Multiple Vitamin (MULTIVITAMIN WITH MINERALS) TABS tablet Take 1 tablet by mouth daily.   [DISCONTINUED] cholecalciferol (VITAMIN D) 1000 units tablet Take 1,000 Units by mouth daily.   [DISCONTINUED] hydrochlorothiazide (MICROZIDE) 12.5 MG capsule Take 1 capsule by mouth once daily (Patient not taking: Reported on 07/29/2023)   [DISCONTINUED] metroNIDAZOLE (FLAGYL) 500 MG tablet Take 1 tablet (500 mg total) by mouth 2 (two) times daily.   No facility-administered encounter medications on file as of 07/29/2023.    Follow-up: Return in about 3 months (around 10/29/2023) for HTN.   Anabel Halon, MD

## 2023-07-29 NOTE — Assessment & Plan Note (Addendum)
 BP Readings from Last 1 Encounters:  07/29/23 138/82   Well-controlled overall with lifestyle modification alone, DASH diet would even improve further DC hydrochlorothiazide for now - if elevated BP in the next visit, will restart hydrochlorothiazide Advised DASH diet and moderate exercise/walking, at least 150 mins/week

## 2023-07-29 NOTE — Assessment & Plan Note (Signed)
 Mild, chronic left knee pain, intermittent Advised to take Tylenol as needed for pain If persistent, can consider getting x-ray of knee Advised to perform simple knee exercises

## 2023-07-29 NOTE — Assessment & Plan Note (Signed)
 Discussed about colonoscopy and cologuard - benefits of each procedure discussed. Patient prefers Cologuard - ordered.

## 2023-07-29 NOTE — Assessment & Plan Note (Signed)

## 2023-07-29 NOTE — Telephone Encounter (Signed)
 Called to setup a mammogram unable to schedule,  needs new order, the patient did not follow up with her 2022 diagnostic bilateral mammogram. Per scheduler needs a new order for diagnostic bilateral mammogram and ultrasound order bilateral.  Patient request a Monday evening appointment at 5:15 pm. Call patient once scheduled.  [phone].

## 2023-07-29 NOTE — Patient Instructions (Signed)
 Please continue taking Womens' health multivitamin.  Please follow DASH diet and perform moderate exercise/walking at least 150 mins/week.

## 2023-07-29 NOTE — Assessment & Plan Note (Signed)
Check lipid profile Advised to follow DASH diet 

## 2023-07-30 LAB — LIPID PANEL
Chol/HDL Ratio: 3 ratio (ref 0.0–4.4)
Cholesterol, Total: 244 mg/dL — ABNORMAL HIGH (ref 100–199)
HDL: 82 mg/dL (ref >39)
LDL Chol Calc (NIH): 139 mg/dL — ABNORMAL HIGH (ref 0–99)
Triglycerides: 134 mg/dL (ref 0–149)
VLDL Cholesterol Cal: 23 mg/dL (ref 5–40)

## 2023-07-30 LAB — CMP14+EGFR
ALT: 22 IU/L (ref 0–32)
AST: 27 IU/L (ref 0–40)
Albumin: 4.2 g/dL (ref 3.8–4.9)
Alkaline Phosphatase: 123 IU/L — ABNORMAL HIGH (ref 44–121)
BUN/Creatinine Ratio: 21 (ref 9–23)
BUN: 24 mg/dL (ref 6–24)
Bilirubin Total: <0.2 mg/dL (ref 0.0–1.2)
CO2: 23 mmol/L (ref 20–29)
Calcium: 9.2 mg/dL (ref 8.7–10.2)
Chloride: 101 mmol/L (ref 96–106)
Creatinine, Ser: 1.16 mg/dL — ABNORMAL HIGH (ref 0.57–1.00)
Globulin, Total: 2.6 g/dL (ref 1.5–4.5)
Glucose: 105 mg/dL — ABNORMAL HIGH (ref 70–99)
Potassium: 4.2 mmol/L (ref 3.5–5.2)
Sodium: 139 mmol/L (ref 134–144)
Total Protein: 6.8 g/dL (ref 6.0–8.5)
eGFR: 56 mL/min/{1.73_m2} — ABNORMAL LOW (ref >59)

## 2023-07-30 LAB — CBC WITH DIFFERENTIAL/PLATELET
Basophils Absolute: 0 10*3/uL (ref 0.0–0.2)
Basos: 1 %
EOS (ABSOLUTE): 0.2 10*3/uL (ref 0.0–0.4)
Eos: 3 %
Hematocrit: 37.4 % (ref 34.0–46.6)
Hemoglobin: 12.2 g/dL (ref 11.1–15.9)
Immature Grans (Abs): 0 10*3/uL (ref 0.0–0.1)
Immature Granulocytes: 1 %
Lymphocytes Absolute: 2.3 10*3/uL (ref 0.7–3.1)
Lymphs: 40 %
MCH: 26.5 pg — ABNORMAL LOW (ref 26.6–33.0)
MCHC: 32.6 g/dL (ref 31.5–35.7)
MCV: 81 fL (ref 79–97)
Monocytes Absolute: 0.5 10*3/uL (ref 0.1–0.9)
Monocytes: 8 %
Neutrophils Absolute: 2.8 10*3/uL (ref 1.4–7.0)
Neutrophils: 47 %
Platelets: 344 10*3/uL (ref 150–450)
RBC: 4.6 x10E6/uL (ref 3.77–5.28)
RDW: 14 % (ref 11.7–15.4)
WBC: 5.8 10*3/uL (ref 3.4–10.8)

## 2023-07-30 LAB — HEMOGLOBIN A1C
Est. average glucose Bld gHb Est-mCnc: 117 mg/dL
Hgb A1c MFr Bld: 5.7 % — ABNORMAL HIGH (ref 4.8–5.6)

## 2023-07-30 LAB — VITAMIN D 25 HYDROXY (VIT D DEFICIENCY, FRACTURES): Vit D, 25-Hydroxy: 53.6 ng/mL (ref 30.0–100.0)

## 2023-07-30 LAB — TSH: TSH: 0.949 u[IU]/mL (ref 0.450–4.500)

## 2023-07-30 LAB — HIV ANTIBODY (ROUTINE TESTING W REFLEX): HIV Screen 4th Generation wRfx: NONREACTIVE

## 2023-07-30 LAB — HEPATITIS C ANTIBODY: Hep C Virus Ab: NONREACTIVE

## 2023-07-30 NOTE — Telephone Encounter (Signed)
 Pt scheduled for 04/10 @ 3 :20pm

## 2023-08-01 ENCOUNTER — Ambulatory Visit

## 2023-08-08 ENCOUNTER — Ambulatory Visit

## 2023-08-21 ENCOUNTER — Encounter (HOSPITAL_COMMUNITY): Payer: Self-pay

## 2023-08-21 ENCOUNTER — Ambulatory Visit (HOSPITAL_COMMUNITY): Admission: RE | Admit: 2023-08-21 | Source: Ambulatory Visit

## 2023-08-21 ENCOUNTER — Ambulatory Visit (HOSPITAL_COMMUNITY)
Admission: RE | Admit: 2023-08-21 | Discharge: 2023-08-21 | Disposition: A | Discharge status: Home / Self Care | Source: Ambulatory Visit | Attending: Internal Medicine | Admitting: Internal Medicine

## 2023-08-21 DIAGNOSIS — Z1231 Encounter for screening mammogram for malignant neoplasm of breast: Secondary | ICD-10-CM | POA: Insufficient documentation

## 2023-08-21 DIAGNOSIS — R928 Other abnormal and inconclusive findings on diagnostic imaging of breast: Secondary | ICD-10-CM | POA: Diagnosis not present

## 2023-08-21 DIAGNOSIS — R921 Mammographic calcification found on diagnostic imaging of breast: Secondary | ICD-10-CM | POA: Diagnosis not present

## 2023-08-21 DIAGNOSIS — R92313 Mammographic fatty tissue density, bilateral breasts: Secondary | ICD-10-CM | POA: Diagnosis not present

## 2023-08-29 LAB — COLOGUARD: COLOGUARD: NEGATIVE

## 2023-10-30 ENCOUNTER — Encounter: Payer: Self-pay | Admitting: Internal Medicine

## 2023-10-30 ENCOUNTER — Ambulatory Visit (INDEPENDENT_AMBULATORY_CARE_PROVIDER_SITE_OTHER): Admitting: Internal Medicine

## 2023-10-30 VITALS — BP 148/88 | HR 99 | Ht 62.0 in | Wt 157.6 lb

## 2023-10-30 DIAGNOSIS — E782 Mixed hyperlipidemia: Secondary | ICD-10-CM | POA: Diagnosis not present

## 2023-10-30 DIAGNOSIS — E663 Overweight: Secondary | ICD-10-CM | POA: Diagnosis not present

## 2023-10-30 DIAGNOSIS — I10 Essential (primary) hypertension: Secondary | ICD-10-CM

## 2023-10-30 DIAGNOSIS — R7303 Prediabetes: Secondary | ICD-10-CM | POA: Insufficient documentation

## 2023-10-30 DIAGNOSIS — R7989 Other specified abnormal findings of blood chemistry: Secondary | ICD-10-CM | POA: Insufficient documentation

## 2023-10-30 MED ORDER — LOSARTAN POTASSIUM 25 MG PO TABS: 25.0000 mg | ORAL_TABLET | Freq: Every day | ORAL | 1 refills | Status: DC

## 2023-10-30 NOTE — Patient Instructions (Addendum)
 Please start taking Losartan 25 mg once daily as prescribed.  Please continue to follow low DASH diet and perform moderate exercise/walking at least 150 mins/week.  Please get fasting blood tests done after 2 weeks.

## 2023-10-30 NOTE — Assessment & Plan Note (Signed)
 Last BMP showed serum creatinine - 1.16 and GFR of 56 Could be related to dehydration Advised to maintain at least 64 ounces of fluid intake Recheck BMP after 2 weeks

## 2023-10-30 NOTE — Assessment & Plan Note (Signed)
 BMI Readings from Last 3 Encounters:  10/30/23 28.83 kg/m  07/29/23 31.50 kg/m  05/18/21 28.99 kg/m   Has lost about 15 lbs since the last visit with diet modification Advised to continue to follow DASH diet and perform moderate exercise/walking at least 150 minutes/week

## 2023-10-30 NOTE — Progress Notes (Addendum)
 Established Patient Office Visit  Subjective:  Patient ID: Alicia Wall, female    DOB: [birth year]  Age: 53 y.o. MRN: [id number]  CC:  Chief Complaint  Patient presents with   Medical Management of Chronic Issues    3 month f/u    HPI Alicia Wall is a 53 y.o. female with past medical history of HTN and HLD who presents for f/u of HTN.  HTN: Her BP was elevated today as well. She denies any headache, dizziness, chest pain, dyspnea or palpitations.  She used to take HCTZ 12.5 mg once daily, but had stopped taking it in 2024 as she had lost 35 lbs weight and she thought she would not need it.   Of note, she has lost 15 lbs weight from previous visit with diet modification.  HLD: LDL was 139 in 03/25.  Prefers lifestyle modification for now.    Past Medical History:  Diagnosis Date   History of trichomoniasis    Hypertension    Trichomonas infection 03/14/2014    Past Surgical History:  Procedure Laterality Date   IRRIGATION AND DEBRIDEMENT ABSCESS Left 04/16/2017   Procedure: MINOR REMOVAL OF FOREIGN BODY NIPPLE;  Surgeon: Awilda Bogus, MD;  Location: [facility];  Service: General;  Laterality: Left;  office LM for pt to arrive at 9:15    Family History  Problem Relation Age of Onset   Heart attack Father    Diabetes Father    Hypertension Paternal Grandfather    Diabetes Paternal Grandmother    Hypertension Paternal Grandmother    Hypertension Maternal Grandmother    Hypertension Maternal Grandfather     Social History   Socioeconomic History   Marital status: Single    Spouse name: Not on file   Number of children: 0   Years of education: Not on file   Highest education level: Not on file  Occupational History   Not on file  Tobacco Use   Smoking status: Former    Current packs/day: 0.00    Types: Cigarettes    Start date: 02/22/1993    Quit date: 02/23/2003    Years since quitting: 20.6   Smokeless tobacco: Never  Vaping Use   Vaping  status: Never Used  Substance and Sexual Activity   Alcohol use: Yes    Alcohol/week: 2.0 standard drinks of alcohol    Types: 2 Standard drinks or equivalent per week    Comment:  on weekends   Drug use: No   Sexual activity: Yes    Birth control/protection: Condom, None  Other Topics Concern   Not on file  Social History Narrative   Not on file   Social Drivers of Health   Financial Resource Strain: Low Risk  (05/18/2021)   Overall Financial Resource Strain (CARDIA)    Difficulty of Paying Living Expenses: Not very hard  Food Insecurity: No Food Insecurity (05/18/2021)   Hunger Vital Sign    Worried About Running Out of Food in the Last Year: Never true    Ran Out of Food in the Last Year: Never true  Transportation Needs: No Transportation Needs (05/18/2021)   PRAPARE - Administrator, Civil Service (Medical): No    Lack of Transportation (Non-Medical): No  Physical Activity: Insufficiently Active (05/18/2021)   Exercise Vital Sign    Days of Exercise per Week: 2 days    Minutes of Exercise per Session: 30 min  Stress: No Stress Concern Present (05/18/2021)   [employer] of  Occupational Health - Occupational Stress Questionnaire    Feeling of Stress : Only a little  Social Connections: Unknown (09/25/2021)   Received from [facility]   Social Network    Social Network: Not on file  Intimate Partner Violence: Unknown (08/17/2021)   Received from [facility]   HITS    Physically Hurt: Not on file    Insult or Talk Down To: Not on file    Threaten Physical Harm: Not on file    Scream or Curse: Not on file    ROS Review of Systems  Constitutional:  Negative for chills and fever.  HENT:  Negative for congestion, sinus pressure, sinus pain and sore throat.   Eyes:  Negative for pain and discharge.  Respiratory:  Negative for cough and shortness of breath.   Cardiovascular:  Negative for chest pain and palpitations.  Gastrointestinal:  Negative for abdominal  pain, diarrhea, nausea and vomiting.  Endocrine: Negative for polydipsia and polyuria.  Genitourinary:  Negative for dysuria and hematuria.  Musculoskeletal:  Positive for arthralgias (Left knee). Negative for neck pain and neck stiffness.  Skin:  Negative for rash.  Neurological:  Negative for dizziness and weakness.  Psychiatric/Behavioral:  Negative for agitation and behavioral problems.     Objective:   Today's Vitals: BP (!) 148/88 (BP Location: Left Arm)   Pulse 99   Ht 5' 2 (1.575 m)   Wt 157 lb 9.6 oz (71.5 kg)   SpO2 94%   BMI 28.83 kg/m   Physical Exam Vitals reviewed.  Constitutional:      General: She is not in acute distress.    Appearance: She is not diaphoretic.  HENT:     Head: Normocephalic and atraumatic.     Nose: Nose normal.     Mouth/Throat:     Mouth: Mucous membranes are moist.   Eyes:     General: No scleral icterus.    Extraocular Movements: Extraocular movements intact.    Cardiovascular:     Rate and Rhythm: Normal rate and regular rhythm.     Heart sounds: Normal heart sounds. No murmur heard. Pulmonary:     Breath sounds: Normal breath sounds. No wheezing or rales.   Musculoskeletal:     Cervical back: Neck supple. No tenderness.     Left knee: No swelling. Normal range of motion. No tenderness.     Right lower leg: No edema.     Left lower leg: No edema.   Skin:    General: Skin is warm.     Findings: No rash.   Neurological:     General: No focal deficit present.     Mental Status: She is alert and oriented to person, place, and time.     Sensory: No sensory deficit.     Motor: No weakness.   Psychiatric:        Mood and Affect: Mood normal.        Behavior: Behavior normal.     Assessment & Plan:   Problem List Items Addressed This Visit       Cardiovascular and Mediastinum   Essential hypertension - Primary   BP Readings from Last 1 Encounters:  10/30/23 (!) 148/88   Uncontrolled with diet modification/weight  loss Added losartan 25 mg QD Counseled for compliance with the medications Advised DASH diet and moderate exercise/walking, at least 150 mins/week       Relevant Medications   losartan (COZAAR) 25 MG tablet   Other Relevant Orders  Basic Metabolic Panel (BMET)     Other   Mixed hyperlipidemia   Checked lipid profile Advised to follow DASH diet      Relevant Medications   losartan (COZAAR) 25 MG tablet   Prediabetes   Lab Results  Component Value Date   HGBA1C 5.7 (H) 07/29/2023   Advised to follow DASH diet      Overweight   BMI Readings from Last 3 Encounters:  10/30/23 28.83 kg/m  07/29/23 31.50 kg/m  05/18/21 28.99 kg/m   Has lost about 15 lbs since the last visit with diet modification Advised to continue to follow DASH diet and perform moderate exercise/walking at least 150 minutes/week      Elevated serum creatinine   Last BMP showed serum creatinine - 1.16 and GFR of 56 Could be related to dehydration Advised to maintain at least 64 ounces of fluid intake Recheck BMP after 2 weeks        Outpatient Encounter Medications as of 10/30/2023  Medication Sig   Cholecalciferol (VITAMIN D ) 50 MCG (2000 UT) tablet Take 2,000 Units by mouth daily.   ibuprofen  (ADVIL ) 200 MG tablet Take 400 mg by mouth as needed.   losartan (COZAAR) 25 MG tablet Take 1 tablet (25 mg total) by mouth daily.   Multiple Vitamin (MULTIVITAMIN WITH MINERALS) TABS tablet Take 1 tablet by mouth daily.   No facility-administered encounter medications on file as of 10/30/2023.    Follow-up: Return in about 4 months (around 02/29/2024) for HTN.   Meldon Sport, MD

## 2023-10-30 NOTE — Assessment & Plan Note (Signed)
Checked lipid profile Advised to follow DASH diet 

## 2023-10-30 NOTE — Assessment & Plan Note (Signed)
 Lab Results  Component Value Date   HGBA1C 5.7 (H) 07/29/2023   Advised to follow DASH diet

## 2023-10-30 NOTE — Assessment & Plan Note (Signed)
 BP Readings from Last 1 Encounters:  10/30/23 (!) 148/88   Uncontrolled with diet modification/weight loss Added losartan 25 mg QD Counseled for compliance with the medications Advised DASH diet and moderate exercise/walking, at least 150 mins/week

## 2024-02-08 DIAGNOSIS — L0390 Cellulitis, unspecified: Secondary | ICD-10-CM | POA: Diagnosis not present

## 2024-03-02 ENCOUNTER — Ambulatory Visit (INDEPENDENT_AMBULATORY_CARE_PROVIDER_SITE_OTHER): Admitting: Internal Medicine

## 2024-03-02 ENCOUNTER — Encounter: Payer: Self-pay | Admitting: Internal Medicine

## 2024-03-02 VITALS — BP 130/80 | HR 84 | Ht 62.0 in | Wt 157.6 lb

## 2024-03-02 DIAGNOSIS — E663 Overweight: Secondary | ICD-10-CM | POA: Diagnosis not present

## 2024-03-02 DIAGNOSIS — I10 Essential (primary) hypertension: Secondary | ICD-10-CM

## 2024-03-02 DIAGNOSIS — E782 Mixed hyperlipidemia: Secondary | ICD-10-CM

## 2024-03-02 MED ORDER — LOSARTAN POTASSIUM 25 MG PO TABS: 25.0000 mg | ORAL_TABLET | Freq: Every day | ORAL | 1 refills | Status: DC

## 2024-03-02 NOTE — Patient Instructions (Signed)
 Please continue to take medications as prescribed.  Please continue to follow DASH diet and perform moderate exercise/walking at least 150 mins/week.

## 2024-03-02 NOTE — Progress Notes (Signed)
 Established Patient Office Visit  Subjective:  Patient ID: Alicia Wall, female    DOB: 06/24/1970  Age: 53 y.o. MRN: [id number]  CC:  Chief Complaint  Patient presents with   Hypertension    4 Month f/u     HPI Alicia Wall is a 53 y.o. female with past medical history of HTN and HLD who presents for f/u of HTN.  HTN: Her BP was elevated initially, but improved later.  She has brought home BP readings, which are around 120s/70s mostly.  She has been taking losartan  25 mg QD regularly.  She denies any headache, dizziness, chest pain, dyspnea or palpitations.  She has not done her BMP since the last visit.  HLD: LDL was 139 in 03/25.  Prefers lifestyle modification for now.    Past Medical History:  Diagnosis Date   History of trichomoniasis    Hypertension    Trichomonas infection 03/14/2014    Past Surgical History:  Procedure Laterality Date   IRRIGATION AND DEBRIDEMENT ABSCESS Left 04/16/2017   Procedure: MINOR REMOVAL OF FOREIGN BODY NIPPLE;  Surgeon: Kallie Manuelita BROCKS, MD;  Location: [facility];  Service: General;  Laterality: Left;  office LM for pt to arrive at 9:15    Family History  Problem Relation Age of Onset   Heart attack Father    Diabetes Father    Hypertension Paternal Grandfather    Diabetes Paternal Grandmother    Hypertension Paternal Grandmother    Hypertension Maternal Grandmother    Hypertension Maternal Grandfather     Social History   Socioeconomic History   Marital status: Single    Spouse name: Not on file   Number of children: 0   Years of education: Not on file   Highest education level: Not on file  Occupational History   Not on file  Tobacco Use   Smoking status: Former    Current packs/day: 0.00    Types: Cigarettes    Start date: 02/22/1993    Quit date: 02/23/2003    Years since quitting: 21.0   Smokeless tobacco: Never  Vaping Use   Vaping status: Never Used  Substance and Sexual Activity   Alcohol use: Yes     Alcohol/week: 2.0 standard drinks of alcohol    Types: 2 Standard drinks or equivalent per week    Comment:  on weekends   Drug use: No   Sexual activity: Yes    Birth control/protection: Condom, None  Other Topics Concern   Not on file  Social History Narrative   Not on file   Social Drivers of Health   Financial Resource Strain: Low Risk  (05/18/2021)   Overall Financial Resource Strain (CARDIA)    Difficulty of Paying Living Expenses: Not very hard  Food Insecurity: No Food Insecurity (05/18/2021)   Hunger Vital Sign    Worried About Running Out of Food in the Last Year: Never true    Ran Out of Food in the Last Year: Never true  Transportation Needs: No Transportation Needs (05/18/2021)   PRAPARE - Administrator, Civil Service (Medical): No    Lack of Transportation (Non-Medical): No  Physical Activity: Insufficiently Active (05/18/2021)   Exercise Vital Sign    Days of Exercise per Week: 2 days    Minutes of Exercise per Session: 30 min  Stress: No Stress Concern Present (05/18/2021)   [employer] of Occupational Health - Occupational Stress Questionnaire    Feeling of Stress : Only a little  Social Connections: Unknown (09/25/2021)   Received from [facility]   Social Network    Social Network: Not on file  Intimate Partner Violence: Unknown (08/17/2021)   Received from [facility]   HITS    Physically Hurt: Not on file    Insult or Talk Down To: Not on file    Threaten Physical Harm: Not on file    Scream or Curse: Not on file    ROS Review of Systems  Constitutional:  Negative for chills and fever.  HENT:  Negative for congestion, sinus pressure, sinus pain and sore throat.   Eyes:  Negative for pain and discharge.  Respiratory:  Negative for cough and shortness of breath.   Cardiovascular:  Negative for chest pain and palpitations.  Gastrointestinal:  Negative for abdominal pain, diarrhea, nausea and vomiting.  Endocrine: Negative for polydipsia  and polyuria.  Genitourinary:  Negative for dysuria and hematuria.  Musculoskeletal:  Negative for neck pain and neck stiffness.  Skin:  Negative for rash.  Neurological:  Negative for dizziness and weakness.  Psychiatric/Behavioral:  Negative for agitation and behavioral problems.     Objective:   Today's Vitals: BP 130/80 (BP Location: Left Arm)   Pulse 84   Ht 5' 2 (1.575 m)   Wt 157 lb 9.6 oz (71.5 kg)   SpO2 98%   BMI 28.83 kg/m   Physical Exam Vitals reviewed.  Constitutional:      General: She is not in acute distress.    Appearance: She is not diaphoretic.  HENT:     Head: Normocephalic and atraumatic.     Nose: Nose normal.     Mouth/Throat:     Mouth: Mucous membranes are moist.  Eyes:     General: No scleral icterus.    Extraocular Movements: Extraocular movements intact.  Cardiovascular:     Rate and Rhythm: Normal rate and regular rhythm.     Heart sounds: Normal heart sounds. No murmur heard. Pulmonary:     Breath sounds: Normal breath sounds. No wheezing or rales.  Musculoskeletal:     Cervical back: Neck supple. No tenderness.     Left knee: No swelling. Normal range of motion. No tenderness.     Right lower leg: No edema.     Left lower leg: No edema.  Skin:    General: Skin is warm.     Findings: No rash.  Neurological:     General: No focal deficit present.     Mental Status: She is alert and oriented to person, place, and time.     Sensory: No sensory deficit.     Motor: No weakness.  Psychiatric:        Mood and Affect: Mood normal.        Behavior: Behavior normal.     Assessment & Plan:   Problem List Items Addressed This Visit       Cardiovascular and Mediastinum   Essential hypertension - Primary   BP Readings from Last 1 Encounters:  03/02/24 130/80   Well-controlled with losartan  25 mg QD Counseled for compliance with the medications Advised DASH diet and moderate exercise/walking, at least 150 mins/week  Needs to get  BMP done today      Relevant Medications   losartan  (COZAAR ) 25 MG tablet     Other   Mixed hyperlipidemia   Checked lipid profile Advised to follow DASH diet      Relevant Medications   losartan  (COZAAR ) 25 MG tablet  Overweight   BMI Readings from Last 3 Encounters:  03/02/24 28.83 kg/m  10/30/23 28.83 kg/m  07/29/23 31.50 kg/m   Has lost about 15 lbs since 03/25 with diet modification Advised to continue to follow DASH diet and perform moderate exercise/walking at least 150 minutes/week         Outpatient Encounter Medications as of 03/02/2024  Medication Sig   Cholecalciferol (VITAMIN D ) 50 MCG (2000 UT) tablet Take 2,000 Units by mouth daily.   ibuprofen  (ADVIL ) 200 MG tablet Take 400 mg by mouth as needed.   Multiple Vitamin (MULTIVITAMIN WITH MINERALS) TABS tablet Take 1 tablet by mouth daily.   [DISCONTINUED] losartan  (COZAAR ) 25 MG tablet Take 1 tablet (25 mg total) by mouth daily.   losartan  (COZAAR ) 25 MG tablet Take 1 tablet (25 mg total) by mouth daily.   No facility-administered encounter medications on file as of 03/02/2024.    Follow-up: Return in about 6 months (around 08/31/2024) for HTN and HLD.   Suzzane MARLA Blanch, MD

## 2024-03-02 NOTE — Assessment & Plan Note (Addendum)
 BP Readings from Last 1 Encounters:  03/02/24 130/80   Well-controlled with losartan  25 mg QD Counseled for compliance with the medications Advised DASH diet and moderate exercise/walking, at least 150 mins/week  Needs to get BMP done today

## 2024-03-02 NOTE — Assessment & Plan Note (Signed)
 BMI Readings from Last 3 Encounters:  03/02/24 28.83 kg/m  10/30/23 28.83 kg/m  07/29/23 31.50 kg/m   Has lost about 15 lbs since 03/25 with diet modification Advised to continue to follow DASH diet and perform moderate exercise/walking at least 150 minutes/week

## 2024-03-02 NOTE — Assessment & Plan Note (Signed)
Checked lipid profile Advised to follow DASH diet 

## 2024-03-03 ENCOUNTER — Ambulatory Visit: Payer: Self-pay | Admitting: Internal Medicine

## 2024-03-03 LAB — BASIC METABOLIC PANEL WITH GFR
BUN/Creatinine Ratio: 15 (ref 9–23)
BUN: 15 mg/dL (ref 6–24)
CO2: 25 mmol/L (ref 20–29)
Calcium: 9.3 mg/dL (ref 8.7–10.2)
Chloride: 102 mmol/L (ref 96–106)
Creatinine, Ser: 0.97 mg/dL (ref 0.57–1.00)
Glucose: 85 mg/dL (ref 70–99)
Potassium: 4.3 mmol/L (ref 3.5–5.2)
Sodium: 139 mmol/L (ref 134–144)
eGFR: 69 mL/min/{1.73_m2}

## 2024-03-09 ENCOUNTER — Telehealth: Payer: Self-pay

## 2024-03-09 NOTE — Telephone Encounter (Signed)
 Copied from CRM [id number]. Topic: Clinical - Medical Advice >> Mar 09, 2024  2:34 PM Alicia Wall wrote: Reason for CRM: Patient called in to let Dr. Tobie she got her flu shot today and due to being 53 yrs old if she should get a shingles shot. Please call [phone]

## 2024-03-10 NOTE — Telephone Encounter (Signed)
Pt informed

## 2024-05-03 ENCOUNTER — Telehealth: Payer: Self-pay | Admitting: Internal Medicine

## 2024-05-03 DIAGNOSIS — I10 Essential (primary) hypertension: Secondary | ICD-10-CM

## 2024-05-03 NOTE — Telephone Encounter (Unsigned)
 Copied from CRM [id number]. Topic: Clinical - Medication Refill >> May 03, 2024  2:30 PM Victoria B wrote: Medication: losartan  (COZAAR ) 25 MG tablet  Has the patient contacted their pharmacy? Nyes but didn't get thru to live person.  This is the patient's preferred pharmacy:  [facility] [address] [place] - [address] [place] [address] [place] [address] Phone: [phone] Fax: [phone]    Is this the correct pharmacy for this prescription? yes  Has the prescription been filled recently? no  Is the patient out of the medication? yes  Has the patient been seen for an appointment in the last year OR does the patient have an upcoming appointment?   Can we respond through MyChart? no  Agent: Please be advised that Rx refills may take up to 3 business days. We ask that you follow-up with your pharmacy.

## 2024-05-04 MED ORDER — LOSARTAN POTASSIUM 25 MG PO TABS: 25.0000 mg | ORAL_TABLET | Freq: Every day | ORAL | 1 refills | Status: AC

## 2024-08-31 ENCOUNTER — Ambulatory Visit: Admitting: Internal Medicine
